# Patient Record
Sex: Female | Born: 1975 | Hispanic: No | Marital: Single | Smoking: Never smoker
Health system: Southern US, Community
[De-identification: ages and names within clinical notes are randomized; demographics above are authoritative.]

## PROBLEM LIST (undated history)

## (undated) DIAGNOSIS — R5383 Other fatigue: Secondary | ICD-10-CM

## (undated) DIAGNOSIS — R600 Localized edema: Secondary | ICD-10-CM

## (undated) DIAGNOSIS — I10 Essential (primary) hypertension: Secondary | ICD-10-CM

## (undated) DIAGNOSIS — E079 Disorder of thyroid, unspecified: Secondary | ICD-10-CM

## (undated) DIAGNOSIS — R0602 Shortness of breath: Secondary | ICD-10-CM

## (undated) DIAGNOSIS — E039 Hypothyroidism, unspecified: Secondary | ICD-10-CM

## (undated) DIAGNOSIS — R52 Pain, unspecified: Secondary | ICD-10-CM

## (undated) HISTORY — DX: Hypothyroidism, unspecified: E03.9

## (undated) HISTORY — DX: Essential (primary) hypertension: I10

## (undated) HISTORY — DX: Shortness of breath: R06.02

## (undated) HISTORY — PX: CHOLECYSTECTOMY: SHX55

## (undated) HISTORY — DX: Pain, unspecified: R52

## (undated) HISTORY — DX: Localized edema: R60.0

## (undated) HISTORY — PX: LIPOSUCTION: SHX10

## (undated) HISTORY — DX: Other fatigue: R53.83

## (undated) NOTE — Unmapped External Note (Signed)
 Formatting of this note might be different from the original. Pt present to ED with upper abdominal pain and epigastric pain X today.  Electronically signed by Lang DELENA Sane, RN at 02/19/2015  5:15 PM PDT

## (undated) NOTE — ED Notes (Signed)
 Formatting of this note might be different from the original. P;t discharged home with instructions,  Electronically signed by Tillman Basket, RN at 02/19/2015  9:04 PM PDT

## (undated) NOTE — ED Provider Notes (Signed)
 Formatting of this note is different from the original. [facility] [address] [place] [address] [phone]  History Chief Complaint  Patient presents with  ? Abdominal Pain   Patient is a 23 y.o. female presenting with abdominal pain.  History provided by:  Patient Language interpreter used: No   Abdominal Pain  Pain location:  Epigastric and RUQ Pain quality: sharp   Pain radiates to:  Does not radiate Pain severity:  Moderate Onset quality:  Gradual Duration:  8 hours Timing:  Intermittent Progression:  Worsening Chronicity:  New Context: eating and suspicious food intake   Context: not alcohol use, not diet changes and not medication withdrawal   Relieved by:  Nothing Worsened by:  Eating Ineffective treatments:  None tried Associated symptoms: no chest pain, no chills, no constipation, no cough, no diarrhea, no dysuria, no fever, no nausea, no shortness of breath and no vomiting    C/O EPIGASTRIC AND RUQ ABDOMINAL SHARP PAINS THAT ARE MOD TO SEVERE STARTING AT ABOUT 11 AM TODAY BUT BECAME WORSE WHEN SHE ATE CHILI LATER. STATES SHE TOOK 2 OF HER MOTHER'S ZANTAC WITHOUT RELIEF. WAS DIAGNOSED WITH GALLSTONES 4 MONTHS AGO History reviewed. No pertinent past medical history.  Past Surgical History  Procedure Laterality Date  ? Cesarean section     No family history on file. none pertinent  Social History  Substance Use Topics  ? Smoking status: Never Smoker  ? Smokeless tobacco: None  ? Alcohol use No   Review of Systems  Constitutional: Negative for chills and fever.  HENT: Negative.   Eyes: Negative.   Respiratory: Negative for cough, choking, chest tightness and shortness of breath.   Cardiovascular: Negative for chest pain and leg swelling.  Gastrointestinal: Positive for abdominal pain. Negative for constipation, diarrhea, nausea and vomiting.  Genitourinary: Negative for difficulty urinating, dysuria, flank pain and frequency.  All other  systems reviewed and are negative.  Physical Exam Visit Vitals  ? BP 112/41  ? Pulse 74  ? Temp 98 F (36.7 C) (Oral)  ? Resp 19  ? Ht 5' 3 (1.6 m)  ? Wt (!) 113 kg (249 lb)  ? LMP  (LMP Unknown)  ? SpO2 98%  ? BMI 44.11 kg/m2   Physical Exam  Constitutional: She is oriented to person, place, and time. She appears well-developed and well-nourished.  HENT:  Head: Normocephalic and atraumatic.  Mouth/Throat: Oropharynx is clear and moist.  Eyes: Pupils are equal, round, and reactive to light.  Neck: Normal range of motion. Neck supple.  Cardiovascular: Normal rate, regular rhythm, normal heart sounds and intact distal pulses.   Pulmonary/Chest: Effort normal and breath sounds normal.  Abdominal: Soft. Bowel sounds are normal. She exhibits no mass. There is tenderness (EPIGASTRIC AND RUQ). There is no rebound and no guarding.  Musculoskeletal: Normal range of motion. She exhibits no edema.  Neurological: She is alert and oriented to person, place, and time. She has normal reflexes.  Skin: Skin is warm and dry.  Nursing note and vitals reviewed.  Results Labs Reviewed  URINALYSIS, ROUTINE - Abnormal       Result Value   Clarity, UA Slightly-Cloudy (*)    Color, UA Yellow     Specific Gravity, UA 1.015     pH, UA 6.5     Leukocytes, UA Negative     Nitrite, UA Negative     Protein, UA Negative     Glucose, UA Negative     Urobilinogen, UA  Normal     Bilirubin, UA Negative     Blood, UA Negative     Ketones, UR Negative    COMPREHENSIVE METABOLIC PANEL - Abnormal    Albumin 3.2 (*)    Sodium 141     Potassium 3.8     Chloride 104     CO2 26.7     BUN 13.0     Creatinine 0.83     Glucose 93     Calcium 8.7     AST 21     ALT 32     Alkaline Phosphatase 81     Total Protein 7.1     Total Bilirubin 0.4     eGFR 76.5     Globulin 3.9    LIPASE, SERUM - Normal   Lipase 216    TROPONIN I - Normal   Troponin I <0.02    AMYLASE, SERUM - Normal   Amylase 109     CBC W/ AUTO DIFF   WBC 10.7     RBC 4.95     Hemoglobin 14.1     Hematocrit 42.6     MCV 86.1     MCH 28.5     MCHC 33.1     RDW 13.7     Platelets 209     MPV 11.9     Neutrophils (Relative) 59.8     Lymphocytes (Relative) 29.0     Monocytes (Relative) 9.8     Eosinophils (Relative) 1.0     Basophils (Relative) 0.4     Neutrophils (Absolute) 6.4     Lymphocytes (Absolute) 3.1     Monocytes (Absolute) 1.1     Eosinophils (Absolute) 0.1     Basophils (Absolute) 0.0    POCT PREGNANCY, URINE   Preg Test, Ur Negative     HCG INTERNAL QC Acceptable     ED Course Procedures  EKG:  Rate/Rhythm:   Normal Sinus Rhythm rate = 64 QRS, ST, T-waves:  No changes consistent w/ acute ischemia Impression:  No evidence of ischemia or arrhythmia  MDM Number of Diagnoses or Management Options Gallstones: established and improving  Amount and/or Complexity of Data Reviewed Clinical lab tests: ordered and reviewed Tests in the radiology section of CPT: ordered and reviewed  Risk of Complications, Morbidity, and/or Mortality Presenting problems: moderate Diagnostic procedures: minimal Management options: minimal  Patient Progress Patient progress: improved  ED Disposition Discharge  None Pcp, MD  Schedule an appointment as soon as possible for a visit in 1 day As needed, If symptoms worsen   Lorrayne JINNY Craven, MD 02/20/15 0002  Electronically signed by Lorrayne JINNY Craven, MD at 02/19/2015  9:02 PM PDT

---

## 2016-06-25 DIAGNOSIS — E079 Disorder of thyroid, unspecified: Secondary | ICD-10-CM | POA: Insufficient documentation

## 2018-04-29 ENCOUNTER — Encounter (HOSPITAL_BASED_OUTPATIENT_CLINIC_OR_DEPARTMENT_OTHER): Payer: Self-pay

## 2018-04-29 ENCOUNTER — Emergency Department (HOSPITAL_BASED_OUTPATIENT_CLINIC_OR_DEPARTMENT_OTHER): Payer: Self-pay

## 2018-04-29 ENCOUNTER — Emergency Department (HOSPITAL_BASED_OUTPATIENT_CLINIC_OR_DEPARTMENT_OTHER)
Admission: EM | Admit: 2018-04-29 | Discharge: 2018-04-29 | Disposition: A | Discharge status: Home / Self Care | Payer: Self-pay | Attending: Emergency Medicine | Admitting: Emergency Medicine

## 2018-04-29 ENCOUNTER — Other Ambulatory Visit: Payer: Self-pay

## 2018-04-29 DIAGNOSIS — R1032 Left lower quadrant pain: Secondary | ICD-10-CM | POA: Insufficient documentation

## 2018-04-29 DIAGNOSIS — Z9049 Acquired absence of other specified parts of digestive tract: Secondary | ICD-10-CM | POA: Insufficient documentation

## 2018-04-29 HISTORY — DX: Disorder of thyroid, unspecified: E07.9

## 2018-04-29 LAB — COMPREHENSIVE METABOLIC PANEL
ALK PHOS: 55 U/L (ref 38–126)
ALT: 16 U/L (ref 0–44)
ANION GAP: 6 (ref 5–15)
AST: 21 U/L (ref 15–41)
Albumin: 3.9 g/dL (ref 3.5–5.0)
BUN: 9 mg/dL (ref 6–20)
CO2: 26 mmol/L (ref 22–32)
Calcium: 8.9 mg/dL (ref 8.9–10.3)
Chloride: 104 mmol/L (ref 98–111)
Creatinine, Ser: 0.71 mg/dL (ref 0.44–1.00)
GFR calc non Af Amer: >60 mL/min (ref >60)
Glucose, Bld: 104 mg/dL — ABNORMAL HIGH (ref 70–99)
Potassium: 3.7 mmol/L (ref 3.5–5.1)
Sodium: 136 mmol/L (ref 135–145)
Total Bilirubin: 0.4 mg/dL (ref 0.3–1.2)
Total Protein: 7.4 g/dL (ref 6.5–8.1)

## 2018-04-29 LAB — URINALYSIS, ROUTINE W REFLEX MICROSCOPIC
Bilirubin Urine: NEGATIVE
GLUCOSE, UA: NEGATIVE mg/dL
Hgb urine dipstick: NEGATIVE
Ketones, ur: NEGATIVE mg/dL
Leukocytes, UA: NEGATIVE
Nitrite: NEGATIVE
Protein, ur: NEGATIVE mg/dL
Specific Gravity, Urine: 1.015 (ref 1.005–1.030)
pH: 7.5 (ref 5.0–8.0)

## 2018-04-29 LAB — CBC
HCT: 40.6 % (ref 36.0–46.0)
Hemoglobin: 12.9 g/dL (ref 12.0–15.0)
MCH: 30.1 pg (ref 26.0–34.0)
MCHC: 31.8 g/dL (ref 30.0–36.0)
MCV: 94.9 fL (ref 80.0–100.0)
NRBC: 0 % (ref 0.0–0.2)
Platelets: 262 10*3/uL (ref 150–400)
RBC: 4.28 MIL/uL (ref 3.87–5.11)
RDW: 12.7 % (ref 11.5–15.5)
WBC: 7.5 10*3/uL (ref 4.0–10.5)

## 2018-04-29 LAB — LIPASE, BLOOD: Lipase: 45 U/L (ref 11–51)

## 2018-04-29 LAB — PREGNANCY, URINE: Preg Test, Ur: NEGATIVE

## 2018-04-29 MED ORDER — IOPAMIDOL (ISOVUE-300) INJECTION 61%
100.0000 mL | Freq: Once | INTRAVENOUS | Status: AC | PRN
  Administered 2018-04-29: 100 mL via INTRAVENOUS

## 2018-04-29 MED ORDER — SODIUM CHLORIDE 0.9 % IV BOLUS
1000.0000 mL | Freq: Once | INTRAVENOUS | Status: AC
  Administered 2018-04-29: 1000 mL via INTRAVENOUS

## 2018-04-29 MED ORDER — ONDANSETRON HCL 4 MG PO TABS: 4.0000 mg | ORAL_TABLET | Freq: Three times a day (TID) | ORAL | 0 refills | Status: DC | PRN

## 2018-04-29 MED ORDER — OXYCODONE-ACETAMINOPHEN 5-325 MG PO TABS: 1.0000 | ORAL_TABLET | ORAL | 0 refills | Status: DC | PRN

## 2018-04-29 MED ORDER — MORPHINE SULFATE (PF) 4 MG/ML IV SOLN
4.0000 mg | Freq: Once | INTRAVENOUS | Status: AC
  Administered 2018-04-29: 4 mg via INTRAVENOUS
  Filled 2018-04-29: qty 1

## 2018-04-29 NOTE — ED Triage Notes (Signed)
C/o LLQ pain started yesterday-denies n/v/d-NAD-steady gait

## 2018-04-29 NOTE — Discharge Instructions (Signed)
Your work-up today showed no evidence of acute abnormality causing your abdominal discomfort today.  We did not see evidence of obstruction, abscess, diverticulitis, appendicitis, or kidney stones.  As we discussed, we do not have the capability for ultrasound today to definitively rule out ovarian torsion however, the CT scan did not indicate pelvic inflammation at this time.  We discussed possibility of transfer for ultrasound however given your improving symptoms, we did not feel necessary to pursue this course at this time.  Please follow-up with your OB/GYN and PCP for further management.  If you develop any new or worsened symptoms that the medication cannot help with, please return to the nearest emergency department and you will likely need a pelvic ultrasound.

## 2018-04-29 NOTE — ED Provider Notes (Signed)
[facility] EMERGENCY DEPARTMENT Provider Note   CSN: [id number] Arrival date & time: 04/29/18  1122     History   Chief Complaint Chief Complaint  Patient presents with  . Abdominal Pain    HPI Kristi Curtis is a 42 y.o. female.  The history is provided by the patient and medical records. No language interpreter was used.  Abdominal Pain   This is a new problem. The current episode started yesterday. The problem occurs constantly. The problem has not changed since onset.The pain is associated with an unknown factor. The pain is located in the LLQ. The quality of the pain is sharp. The pain is at a severity of 10/10. The pain is severe. Pertinent negatives include fever, belching, diarrhea, flatus, nausea, vomiting, constipation, dysuria, frequency and headaches. The symptoms are aggravated by palpation. Nothing relieves the symptoms.    Past Medical History:  Diagnosis Date  . Thyroid disease     There are no active problems to display for this patient.   Past Surgical History:  Procedure Laterality Date  . CHOLECYSTECTOMY       OB History   None      Home Medications    Prior to Admission medications   Not on File    Family History No family history on file.  Social History Social History   Tobacco Use  . Smoking status: Never Smoker  . Smokeless tobacco: Never Used  Substance Use Topics  . Alcohol use: Never    Frequency: Never  . Drug use: Never     Allergies   Patient has no known allergies.   Review of Systems Review of Systems  Constitutional: Negative for chills, diaphoresis, fatigue and fever.  HENT: Negative for congestion and rhinorrhea.   Respiratory: Negative for cough, chest tightness, shortness of breath and wheezing.   Cardiovascular: Negative for chest pain, palpitations and leg swelling.  Gastrointestinal: Positive for abdominal pain. Negative for abdominal distention, constipation, diarrhea, flatus, nausea and  vomiting.  Genitourinary: Negative for dysuria, flank pain and frequency.  Musculoskeletal: Negative for back pain, neck pain and neck stiffness.  Skin: Negative for rash and wound.  Neurological: Negative for light-headedness and headaches.  Psychiatric/Behavioral: Negative for agitation.  All other systems reviewed and are negative.    Physical Exam Updated Vital Signs BP (!) 155/93 (BP Location: Right Arm)   Pulse 69   Temp 98.2 F (36.8 C) (Oral)   Resp 18   Ht 5\' 3"  (1.6 m)   Wt 103.4 kg   LMP 04/18/2018   SpO2 100%   BMI 40.39 kg/m   Physical Exam  Constitutional: She appears well-developed and well-nourished. No distress.  HENT:  Head: Normocephalic and atraumatic.  Mouth/Throat: Oropharynx is clear and moist. No oropharyngeal exudate.  Eyes: Pupils are equal, round, and reactive to light. Conjunctivae are normal.  Neck: Normal range of motion. Neck supple.  Cardiovascular: Normal rate and regular rhythm.  No murmur heard. Pulmonary/Chest: Effort normal and breath sounds normal. No respiratory distress. She has no wheezes. She has no rales. She exhibits no tenderness.  Abdominal: Soft. Normal appearance. There is tenderness in the left lower quadrant. There is no rigidity, no rebound, no guarding and no CVA tenderness.    Musculoskeletal: She exhibits no edema or tenderness.  Lymphadenopathy:    She has no cervical adenopathy.  Neurological: She is alert. No sensory deficit. She exhibits normal muscle tone.  Skin: Skin is warm and dry. Capillary refill takes less than  2 seconds. No rash noted. She is not diaphoretic. No erythema.  Psychiatric: She has a normal mood and affect.  Nursing note and vitals reviewed.    ED Treatments / Results  Labs (all labs ordered are listed, but only abnormal results are displayed) Labs Reviewed  COMPREHENSIVE METABOLIC PANEL - Abnormal; Notable for the following components:      Result Value   Glucose, Bld 104 (*)    All  other components within normal limits  URINALYSIS, ROUTINE W REFLEX MICROSCOPIC - Abnormal; Notable for the following components:   APPearance CLOUDY (*)    All other components within normal limits  LIPASE, BLOOD  CBC  PREGNANCY, URINE    EKG None  Radiology Ct Abdomen Pelvis W Contrast  Result Date: 04/29/2018 CLINICAL DATA:  Abdominal pain EXAM: CT ABDOMEN AND PELVIS WITH CONTRAST TECHNIQUE: Multidetector CT imaging of the abdomen and pelvis was performed using the standard protocol following bolus administration of intravenous contrast. CONTRAST:  ISOVUE-300 IOPAMIDOL (ISOVUE-300) INJECTION 61% COMPARISON:  None. FINDINGS: Lower chest: Lung bases demonstrate no acute consolidation or effusion. The heart size is normal Hepatobiliary: No focal liver abnormality is seen. Status post cholecystectomy. No biliary dilatation. Pancreas: Unremarkable. No pancreatic ductal dilatation or surrounding inflammatory changes. Spleen: Normal in size without focal abnormality. Adrenals/Urinary Tract: Adrenal glands are unremarkable. Kidneys are normal, without renal calculi, focal lesion, or hydronephrosis. Bladder is unremarkable. Stomach/Bowel: Stomach is within normal limits. Appendix appears normal. No evidence of bowel wall thickening, distention, or inflammatory changes. Few sigmoid colon diverticula without acute inflammatory process. Vascular/Lymphatic: Nonaneurysmal aorta. No significantly enlarged lymph nodes. Reproductive: Uterus is unremarkable.  2.3 cm left adnexal cyst. Other: Negative for free air or free fluid. Scarring within the low anterior abdominal wall. Musculoskeletal: No acute or suspicious abnormality. IMPRESSION: 1. No CT evidence for acute intra-abdominal or pelvic abnormality. 2. Few sigmoid colon diverticula without acute inflammatory process. Electronically Signed   By: Jasmine Pang M.D.   On: 04/29/2018 18:23    Procedures Procedures (including critical care  time)  Medications Ordered in ED Medications  morphine 4 MG/ML injection 4 mg (4 mg Intravenous Given 04/29/18 1602)  sodium chloride 0.9 % bolus 1,000 mL ( Intravenous Stopped 04/29/18 1704)  iopamidol (ISOVUE-300) 61 % injection 100 mL (100 mLs Intravenous Contrast Given 04/29/18 1737)     Initial Impression / Assessment and Plan / ED Course  I have reviewed the triage vital signs and the nursing notes.  Pertinent labs & imaging results that were available during my care of the patient were reviewed by me and considered in my medical decision making (see chart for details).     Analayah Brooke is a 42 y.o. female with a past medical history significant for prior cholecystectomy, 2 tummy tucks and 2 C-sections who presents with left lower abdominal pain.  She denies any history of kidney stones, ovarian torsion, or diverticulitis but reports yesterday afternoon she had sudden onset of severe sharp left lower quadrant abdominal pain.  She reports he said no nausea, vomiting, and had a normal bowel movement this morning with no bleeding constipation or diarrhea.  She reports the pain is up to 10 out of 10 and is sharp.  It comes and goes.  She reports having a very brief episode several months ago that lasted several hours but went away.  This is been persistent.  She denies fevers, chills, congestion, cough, shortness of breath, chest pain, palpitations.  She denies any trauma.  She  denies any new medications.  She has not taken any medicine to help her symptoms.  She denies any vaginal discharge, vaginal bleeding, or pelvic pain.  On exam, patient has severe left lower quadrant tenderness.  No CVA tenderness or flank tenderness.  Abdomen otherwise nontender.  Normal bowel sounds.  Lungs clear chest nontender.  Legs are nontender or edematous.  Clinically I am concerned patient may have either diverticulitis, idiopathic abdominal pain, or ovarian torsion.  Due to facility restraints, we will order  pelvic ultrasound initially as the ultrasound tech will leave in the next 10 minutes.  Typically we would do a pelvic exam initially however shared decision in conversation held to obtain ultrasound first.  If ultrasound is negative, will likely proceed with CT scan to rule out diverticulitis as cause of the 10 out of 10 severe abdominal pain.  Patient was given pain medicine and fluids initially.  Anticipate reassessment after work-up.  4:40 PM Was informed several minutes ago that the ultrasound is not can be available at this facility due to ultrasound tech leaving.  Thus, we will have CT scan instead of pelvic ultrasound initially.  If there is any concern for pelvic abnormality on CT scan, patient will likely need transfer for pelvic ultrasound.  Patient CT scan was reassuring.  Patient has diverticulosis but not diverticulitis.  Patient was feeling better after medications.  Patient is not interested in transfer for ultrasound at this time.  Patient will give prescription for pain medicine and nausea medicine and will be discharged home.  She will follow-up with her PCP and understood strict return precautions.  Patient had no other questions or concerns and was discharged in good condition.   Final Clinical Impressions(s) / ED Diagnoses   Final diagnoses:  LLQ abdominal pain    ED Discharge Orders         Ordered    oxyCODONE-acetaminophen (PERCOCET/ROXICET) 5-325 MG tablet  Every 4 hours PRN     04/29/18 1931    ondansetron (ZOFRAN) 4 MG tablet  Every 8 hours PRN     04/29/18 1931         Clinical Impression: 1. LLQ abdominal pain     Disposition: Discharge  Condition: Good  I have discussed the results, Dx and Tx plan with the pt(& family if present). He/she/they expressed understanding and agree(s) with the plan. Discharge instructions discussed at great length. Strict return precautions discussed and pt &/or family have verbalized understanding of the instructions. No  further questions at time of discharge.    Discharge Medication List as of 04/29/2018  7:32 PM    START taking these medications   Details  ondansetron (ZOFRAN) 4 MG tablet Take 1 tablet (4 mg total) by mouth every 8 (eight) hours as needed for nausea or vomiting., Starting Tue 04/29/2018, Print    oxyCODONE-acetaminophen (PERCOCET/ROXICET) 5-325 MG tablet Take 1 tablet by mouth every 4 (four) hours as needed., Starting Tue 04/29/2018, Print        Follow Up: [facility] [address] [place] [address] [phone] Schedule an appointment as soon as possible for a visit    [facility] [address] [id number] [place] [address] [phone]       , Canary Brim, MD 04/30/18 [phone]

## 2018-04-29 NOTE — ED Notes (Signed)
LLQ Flank pain for 2 days. Denies vaginal discharge. Vomiting or diarrhea. Pt resting on stretcher comfortably.

## 2018-04-29 NOTE — ED Notes (Signed)
PO fluids given

## 2018-09-08 DIAGNOSIS — R109 Unspecified abdominal pain: Secondary | ICD-10-CM | POA: Diagnosis not present

## 2018-09-08 DIAGNOSIS — R198 Other specified symptoms and signs involving the digestive system and abdomen: Secondary | ICD-10-CM | POA: Diagnosis not present

## 2019-06-05 DIAGNOSIS — Z20828 Contact with and (suspected) exposure to other viral communicable diseases: Secondary | ICD-10-CM | POA: Diagnosis not present

## 2019-07-15 DIAGNOSIS — U071 COVID-19: Secondary | ICD-10-CM | POA: Diagnosis not present

## 2019-07-15 DIAGNOSIS — R002 Palpitations: Secondary | ICD-10-CM | POA: Diagnosis not present

## 2019-07-15 DIAGNOSIS — R0602 Shortness of breath: Secondary | ICD-10-CM | POA: Diagnosis not present

## 2019-07-15 DIAGNOSIS — R55 Syncope and collapse: Secondary | ICD-10-CM | POA: Diagnosis not present

## 2019-07-17 DIAGNOSIS — I340 Nonrheumatic mitral (valve) insufficiency: Secondary | ICD-10-CM

## 2019-07-17 DIAGNOSIS — I361 Nonrheumatic tricuspid (valve) insufficiency: Secondary | ICD-10-CM

## 2019-07-17 DIAGNOSIS — J40 Bronchitis, not specified as acute or chronic: Secondary | ICD-10-CM | POA: Diagnosis not present

## 2019-07-17 DIAGNOSIS — R0602 Shortness of breath: Secondary | ICD-10-CM | POA: Diagnosis not present

## 2019-07-17 DIAGNOSIS — R002 Palpitations: Secondary | ICD-10-CM | POA: Diagnosis not present

## 2019-07-22 DIAGNOSIS — E039 Hypothyroidism, unspecified: Secondary | ICD-10-CM | POA: Diagnosis not present

## 2019-07-22 DIAGNOSIS — J209 Acute bronchitis, unspecified: Secondary | ICD-10-CM | POA: Diagnosis not present

## 2019-09-01 DIAGNOSIS — E039 Hypothyroidism, unspecified: Secondary | ICD-10-CM | POA: Diagnosis not present

## 2019-09-01 DIAGNOSIS — R748 Abnormal levels of other serum enzymes: Secondary | ICD-10-CM | POA: Diagnosis not present

## 2019-09-02 DIAGNOSIS — Z6841 Body Mass Index (BMI) 40.0 and over, adult: Secondary | ICD-10-CM | POA: Diagnosis not present

## 2019-09-02 DIAGNOSIS — R609 Edema, unspecified: Secondary | ICD-10-CM | POA: Diagnosis not present

## 2019-09-02 DIAGNOSIS — E039 Hypothyroidism, unspecified: Secondary | ICD-10-CM | POA: Diagnosis not present

## 2019-10-01 DIAGNOSIS — M25562 Pain in left knee: Secondary | ICD-10-CM | POA: Diagnosis not present

## 2019-10-02 DIAGNOSIS — M25562 Pain in left knee: Secondary | ICD-10-CM | POA: Diagnosis not present

## 2019-10-02 DIAGNOSIS — S8992XA Unspecified injury of left lower leg, initial encounter: Secondary | ICD-10-CM | POA: Diagnosis not present

## 2019-10-13 DIAGNOSIS — E039 Hypothyroidism, unspecified: Secondary | ICD-10-CM | POA: Diagnosis not present

## 2020-05-24 ENCOUNTER — Other Ambulatory Visit: Payer: Self-pay

## 2020-05-24 ENCOUNTER — Encounter (INDEPENDENT_AMBULATORY_CARE_PROVIDER_SITE_OTHER): Payer: Self-pay | Admitting: Family Medicine

## 2020-05-24 ENCOUNTER — Ambulatory Visit (INDEPENDENT_AMBULATORY_CARE_PROVIDER_SITE_OTHER): Payer: BC Managed Care – PPO | Admitting: Family Medicine

## 2020-05-24 VITALS — BP 148/87 | HR 56 | Temp 97.6°F | Ht 64.0 in | Wt 287.0 lb

## 2020-05-24 DIAGNOSIS — Z0289 Encounter for other administrative examinations: Secondary | ICD-10-CM

## 2020-05-24 DIAGNOSIS — U099 Post covid-19 condition, unspecified: Secondary | ICD-10-CM

## 2020-05-24 DIAGNOSIS — R0683 Snoring: Secondary | ICD-10-CM

## 2020-05-24 DIAGNOSIS — R600 Localized edema: Secondary | ICD-10-CM

## 2020-05-24 DIAGNOSIS — R0602 Shortness of breath: Secondary | ICD-10-CM | POA: Diagnosis not present

## 2020-05-24 DIAGNOSIS — Z9189 Other specified personal risk factors, not elsewhere classified: Secondary | ICD-10-CM | POA: Diagnosis not present

## 2020-05-24 DIAGNOSIS — R5383 Other fatigue: Secondary | ICD-10-CM | POA: Diagnosis not present

## 2020-05-24 DIAGNOSIS — E039 Hypothyroidism, unspecified: Secondary | ICD-10-CM | POA: Diagnosis not present

## 2020-05-24 DIAGNOSIS — I10 Essential (primary) hypertension: Secondary | ICD-10-CM

## 2020-05-24 DIAGNOSIS — E038 Other specified hypothyroidism: Secondary | ICD-10-CM | POA: Diagnosis not present

## 2020-05-24 DIAGNOSIS — F3289 Other specified depressive episodes: Secondary | ICD-10-CM

## 2020-05-24 DIAGNOSIS — R7303 Prediabetes: Secondary | ICD-10-CM | POA: Diagnosis not present

## 2020-05-24 DIAGNOSIS — Z6841 Body Mass Index (BMI) 40.0 and over, adult: Secondary | ICD-10-CM

## 2020-05-24 DIAGNOSIS — E559 Vitamin D deficiency, unspecified: Secondary | ICD-10-CM | POA: Diagnosis not present

## 2020-05-24 NOTE — Progress Notes (Signed)
Chief Complaint:   OBESITY Kristi Curtis (MR# [id number]) is a 45 y.o. female who presents for evaluation and treatment of obesity and related comorbidities. Current BMI is Body mass index is 49.26 kg/m. Kristi Curtis has been struggling with her weight for many years and has been unsuccessful in either losing weight, maintaining weight loss, or reaching her healthy weight goal.  Kristi Curtis is currently in the action stage of change and ready to dedicate time achieving and maintaining a healthier weight. Kristi Curtis is interested in becoming our patient and working on intensive lifestyle modifications including (but not limited to) diet and exercise for weight loss.  Kristi Curtis works at [employer] for 40+ hours per week.  She lives with her boyfriend and 2 sons (67 and 7).  She wants to be vegan.  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  She had a tummy tuck 2 years ago.  Kristi Curtis's habits were reviewed today and are as follows: Her family eats meals together, she thinks her family will eat healthier with her, her desired weight loss is 110 pounds, she started gaining weight after age 56, her heaviest weight ever was 275 pounds, she craves bread and sweets, she is trying to follow a vegan diet, she is frequently drinking liquids with calories, she frequently makes poor food choices and she struggles with emotional eating.  Depression Screen Kristi Curtis's Food and Mood (modified PHQ-9) score was 12.  Depression screen PHQ 2/9 05/24/2020  Decreased Interest 2  Down, Depressed, Hopeless 2  PHQ - 2 Score 4  Altered sleeping 3  Tired, decreased energy 3  Change in appetite 2  Feeling bad or failure about yourself  0  Trouble concentrating 0  Moving slowly or fidgety/restless 0  Suicidal thoughts 0  PHQ-9 Score 12  Difficult doing work/chores Not difficult at all   Assessment/Plan:   1. Other fatigue Kristi Curtis admits to daytime somnolence and reports waking up still tired. Patent has a history of symptoms of daytime  fatigue, morning fatigue and snoring. Kristi Curtis generally gets 5 hours of sleep per night. Snoring is present. Apneic episodes are present. Epworth Sleepiness Score is 5.  [name] does feel that her weight is causing her energy to be lower than it should be. Fatigue may be related to obesity, depression or many other causes. Labs will be ordered, and in the meanwhile, Kristi Curtis will focus on self care including making healthy food choices, increasing physical activity and focusing on stress reduction.  - EKG 12-Lead - Anemia panel - CBC with Differential/Platelet - Comprehensive metabolic panel - Hemoglobin A1c - Insulin, random - Lipid panel - VITAMIN D 25 Hydroxy (Vit-D Deficiency, Fractures) - TSH - T4, free  2. SOB (shortness of breath) on exertion Kristi Curtis notes increasing shortness of breath with exercising and seems to be worsening over time with weight gain. She notes getting out of breath sooner with activity than she used to. This has gotten worse recently. Kristi Curtis denies shortness of breath at rest or orthopnea.  Kristi Curtis does feel that she gets out of breath more easily that she used to when she exercises. Kristi Curtis's shortness of breath appears to be obesity related and exercise induced. She has agreed to work on weight loss and gradually increase exercise to treat her exercise induced shortness of breath. Will continue to monitor closely.  - EKG 12-Lead - Anemia panel - CBC with Differential/Platelet - Comprehensive metabolic panel - Hemoglobin A1c - Insulin, random - Lipid panel - VITAMIN  D 25 Hydroxy (Vit-D Deficiency, Fractures) - TSH - T4, free  3. Other specified hypothyroidism Medication: levothyroxine 175 mcg daily.   Plan: Patient was instructed not to take MVM or iron within 4 hours of taking thyroid medications. We will continue to monitor symptoms as they relate to her weight loss journey.  4. Essential hypertension Elevated today. Medications: None.   Plan: Avoid buying foods that are:  processed, frozen, or prepackaged to avoid excess salt. We will continue to monitor symptoms as they relate to her weight loss journey.  BP Readings from Last 3 Encounters:  05/24/20 (!) 148/87  04/29/18 (!) 148/78   Lab Results  Component Value Date   CREATININE 0.71 04/29/2018   5. Snores Situation Chance of Dozing or Sleeping  Sitting and reading 1 = slight chance of dozing or sleeping  Watching television 1 = slight chance of dozing or sleeping  Sitting as a passenger in a car for an hour 1 = slight chance of dozing or sleeping  Sitting inactive in a public place (theater or meeting) 0 = would never doze or sleep  Lying down in the afternoon when circumstances permit 1 = slight chance of dozing or sleeping  Sitting and talking to someone 0 = would never doze or sleep  Sitting quietly after lunch without alcohol 1 = slight chance of dozing or sleeping  In a car, while stopped for a few minutes in traffic 0 = would never doze or sleep  TOTAL 5   6. Lower extremity edema Amyria has 1-2+ lower extremity edema.  Plan:  Will consider diuretic at next visit.  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  8. Other depression, with emotional eating Behavior modification techniques were discussed today to help Kristi Curtis deal with her emotional/non-hunger eating behaviors.    9. At risk for heart disease Kristi Curtis was given approximately 10 minutes of coronary artery disease prevention counseling today. She is 45 y.o. female and has risk factors for heart disease including obesity and hypertension. We discussed intensive lifestyle modifications today with an emphasis on specific weight loss instructions and strategies.   10. Class 3 severe obesity with serious comorbidity and body mass index (BMI) of 45.0 to 49.9 in adult, unspecified obesity type (HCC)  Kristi Curtis is currently in the action stage of change and her goal is to continue with weight loss efforts. I recommend Sona begin the structured  treatment plan as follows:  She has agreed to the Category 2 Plan (vegan with chicken).  Exercise goals: No exercise has been prescribed at this time.   Behavioral modification strategies: increasing lean protein intake, decreasing simple carbohydrates, increasing vegetables, increasing water intake, decreasing liquid calories, decreasing alcohol intake and decreasing sodium intake.  She was informed of the importance of frequent follow-up visits to maximize her success with intensive lifestyle modifications for her multiple health conditions. She was informed we would discuss her lab results at her next visit unless there is a critical issue that needs to be addressed sooner. Candis agreed to keep her next visit at the agreed upon time to discuss these results.  Objective:   Blood pressure (!) 148/87, pulse (!) 56, temperature 97.6 F (36.4 C), temperature source Oral, height 5\' 4"  (1.626 m), weight 287 lb (130.2 kg), SpO2 96 %. Body mass index is 49.26 kg/m.  EKG: Normal sinus rhythm, rate 69 bpm.  Indirect Calorimeter completed today shows a VO2 of 224 and a REE of 1556.  Her calculated basal  metabolic rate is 6269 thus her basal metabolic rate is worse than expected.  General: Cooperative, alert, well developed, in no acute distress. HEENT: Conjunctivae and lids unremarkable. Cardiovascular: Regular rhythm.  Lungs: Normal work of breathing. Neurologic: No focal deficits.   Lab Results  Component Value Date   CREATININE 0.71 04/29/2018   BUN 9 04/29/2018   Kristi 136 04/29/2018   K 3.7 04/29/2018   CL 104 04/29/2018   CO2 26 04/29/2018   Lab Results  Component Value Date   ALT 16 04/29/2018   AST 21 04/29/2018   ALKPHOS 55 04/29/2018   BILITOT 0.4 04/29/2018   Lab Results  Component Value Date   WBC 7.5 04/29/2018   HGB 12.9 04/29/2018   HCT 40.6 04/29/2018   MCV 94.9 04/29/2018   PLT 262 04/29/2018   Attestation Statements:   Reviewed by clinician on day of visit:  allergies, medications, problem list, medical history, surgical history, family history, social history, and previous encounter notes.  This is the patient's first visit at [facility]. The patient's NEW PATIENT PACKET was reviewed at length. Included in the packet: current and past health history, medications, allergies, ROS, gynecologic history (women only), surgical history, family history, social history, weight history, weight loss surgery history (for those that have had weight loss surgery), nutritional evaluation, mood and food questionnaire, PHQ9, Epworth questionnaire, sleep habits questionnaire, patient life and health improvement goals questionnaire. These will all be scanned into the patient's chart under media.   During the visit, I independently reviewed the patient's EKG, bioimpedance scale results, and indirect calorimeter results. I used this information to tailor a meal plan for the patient that will help her to lose weight and will improve her obesity-related conditions going forward. I performed a medically necessary appropriate examination and/or evaluation. I discussed the assessment and treatment plan with the patient. The patient was provided an opportunity to ask questions and all were answered. The patient agreed with the plan and demonstrated an understanding of the instructions. Labs were ordered at this visit and will be reviewed at the next visit unless more critical results need to be addressed immediately. Clinical information was updated and documented in the EMR.   I, Insurance claims handler, CMA, am acting as transcriptionist for Helane Rima, DO  I have reviewed the above documentation for accuracy and completeness, and I agree with the above. Helane Rima, DO

## 2020-05-25 LAB — CBC WITH DIFFERENTIAL/PLATELET
Basophils Absolute: 0.1 10*3/uL (ref 0.0–0.2)
Basos: 1 %
EOS (ABSOLUTE): 0.1 10*3/uL (ref 0.0–0.4)
Eos: 2 %
Hemoglobin: 15.1 g/dL (ref 11.1–15.9)
Immature Grans (Abs): 0 10*3/uL (ref 0.0–0.1)
Immature Granulocytes: 0 %
Lymphocytes Absolute: 2 10*3/uL (ref 0.7–3.1)
Lymphs: 28 %
MCH: 28.9 pg (ref 26.6–33.0)
MCHC: 32.1 g/dL (ref 31.5–35.7)
MCV: 90 fL (ref 79–97)
Monocytes Absolute: 0.4 10*3/uL (ref 0.1–0.9)
Monocytes: 5 %
Neutrophils Absolute: 4.7 10*3/uL (ref 1.4–7.0)
Neutrophils: 64 %
Platelets: 264 10*3/uL (ref 150–450)
RBC: 5.23 x10E6/uL (ref 3.77–5.28)
RDW: 14.9 % (ref 11.7–15.4)
WBC: 7.3 10*3/uL (ref 3.4–10.8)

## 2020-05-25 LAB — COMPREHENSIVE METABOLIC PANEL
ALT: 67 IU/L — ABNORMAL HIGH (ref 0–32)
AST: 66 IU/L — ABNORMAL HIGH (ref 0–40)
Albumin/Globulin Ratio: 1.5 (ref 1.2–2.2)
Albumin: 4.3 g/dL (ref 3.8–4.8)
Alkaline Phosphatase: 79 IU/L (ref 44–121)
BUN/Creatinine Ratio: 6 — ABNORMAL LOW (ref 9–23)
BUN: 6 mg/dL (ref 6–24)
Bilirubin Total: 0.6 mg/dL (ref 0.0–1.2)
CO2: 26 mmol/L (ref 20–29)
Calcium: 9.4 mg/dL (ref 8.7–10.2)
Chloride: 100 mmol/L (ref 96–106)
Creatinine, Ser: 0.93 mg/dL (ref 0.57–1.00)
GFR calc Af Amer: 86 mL/min/{1.73_m2} (ref >59)
GFR calc non Af Amer: 75 mL/min/{1.73_m2} (ref >59)
Globulin, Total: 2.9 g/dL (ref 1.5–4.5)
Glucose: 81 mg/dL (ref 65–99)
Potassium: 4.1 mmol/L (ref 3.5–5.2)
Sodium: 139 mmol/L (ref 134–144)
Total Protein: 7.2 g/dL (ref 6.0–8.5)

## 2020-05-25 LAB — ANEMIA PANEL
Ferritin: 69 ng/mL (ref 15–150)
Folate, Hemolysate: 417 ng/mL
Folate, RBC: 887 ng/mL (ref >498)
Hematocrit: 47 % — ABNORMAL HIGH (ref 34.0–46.6)
Iron Saturation: 23 % (ref 15–55)
Iron: 95 ug/dL (ref 27–159)
Retic Ct Pct: 1.6 % (ref 0.6–2.6)
Total Iron Binding Capacity: 405 ug/dL (ref 250–450)
UIBC: 310 ug/dL (ref 131–425)
Vitamin B-12: 1156 pg/mL (ref 232–1245)

## 2020-05-25 LAB — LIPID PANEL
Chol/HDL Ratio: 3.5 ratio (ref 0.0–4.4)
Cholesterol, Total: 222 mg/dL — ABNORMAL HIGH (ref 100–199)
HDL: 63 mg/dL (ref >39)
LDL Chol Calc (NIH): 137 mg/dL — ABNORMAL HIGH (ref 0–99)
Triglycerides: 126 mg/dL (ref 0–149)
VLDL Cholesterol Cal: 22 mg/dL (ref 5–40)

## 2020-05-25 LAB — TSH: TSH: 226 u[IU]/mL — ABNORMAL HIGH (ref 0.450–4.500)

## 2020-05-25 LAB — HEMOGLOBIN A1C
Est. average glucose Bld gHb Est-mCnc: 137 mg/dL
Hgb A1c MFr Bld: 6.4 % — ABNORMAL HIGH (ref 4.8–5.6)

## 2020-05-25 LAB — VITAMIN D 25 HYDROXY (VIT D DEFICIENCY, FRACTURES): Vit D, 25-Hydroxy: 8.6 ng/mL — ABNORMAL LOW (ref 30.0–100.0)

## 2020-05-25 LAB — T4, FREE: Free T4: <0.1 ng/dL — ABNORMAL LOW (ref 0.82–1.77)

## 2020-05-25 LAB — INSULIN, RANDOM: INSULIN: 18 u[IU]/mL (ref 2.6–24.9)

## 2020-06-01 ENCOUNTER — Telehealth (INDEPENDENT_AMBULATORY_CARE_PROVIDER_SITE_OTHER): Payer: Self-pay

## 2020-06-01 NOTE — Telephone Encounter (Signed)
Spoke with pt about results

## 2020-06-01 NOTE — Telephone Encounter (Signed)
This patient called returning your call from yesterday.  Phone # [phone]

## 2020-06-02 ENCOUNTER — Other Ambulatory Visit (INDEPENDENT_AMBULATORY_CARE_PROVIDER_SITE_OTHER): Payer: Self-pay

## 2020-06-02 DIAGNOSIS — R7989 Other specified abnormal findings of blood chemistry: Secondary | ICD-10-CM

## 2020-06-02 DIAGNOSIS — E038 Other specified hypothyroidism: Secondary | ICD-10-CM

## 2020-06-06 DIAGNOSIS — E039 Hypothyroidism, unspecified: Secondary | ICD-10-CM | POA: Insufficient documentation

## 2020-06-06 DIAGNOSIS — U099 Post covid-19 condition, unspecified: Secondary | ICD-10-CM | POA: Insufficient documentation

## 2020-06-06 DIAGNOSIS — F32A Depression, unspecified: Secondary | ICD-10-CM | POA: Insufficient documentation

## 2020-06-06 DIAGNOSIS — I10 Essential (primary) hypertension: Secondary | ICD-10-CM | POA: Insufficient documentation

## 2020-06-06 NOTE — Progress Notes (Signed)
TeleHealth Visit:  Due to the COVID-19 pandemic, this visit was completed with telemedicine (audio/video) technology to reduce patient and provider exposure as well as to preserve personal protective equipment.   Kristi Curtis has verbally consented to this TeleHealth visit. The patient is located at home, the provider is located at the [facility] office. The participants in this visit include the listed provider and patient and. The visit was conducted today via MyChart.  OBESITY Kristi Curtis is here to discuss her progress with her obesity treatment plan along with follow-up of her obesity related diagnoses.   Today's visit was #: 2 Starting weight: 287 lbs Starting date: 05/24/2020 Today's date: 06/07/2020  Interim History: Kristi Curtis is scheduled for an appointment with Endocrinology on 07/11/2020. Nutrition Plan: the Category 2 Plan for 90% of the time.  Activity: None at this time.  Assessment/Plan:   1. Essential hypertension Elevated today.  Medications: None.   Plan: Avoid buying foods that are: processed, frozen, or prepackaged to avoid excess salt. We will continue to monitor symptoms as they relate to her weight loss journey.  BP Readings from Last 3 Encounters:  05/24/20 (!) 148/87  04/29/18 (!) 148/78   Lab Results  Component Value Date   CREATININE 0.93 05/24/2020   2. COVID-19 long hauler Stable. We will continue to monitor symptoms as they relate to her weight loss journey. She will continue to focus on protein-rich, low simple carbohydrate foods. We reviewed the importance of hydration, regular exercise for stress reduction, and restorative sleep.   3. Acquired hypothyroidism Medication: levothyroxine 175 mcg daily.   Lab Results  Component Value Date   TSH 226.000 (H) 05/24/2020   Plan: Patient was instructed not to take MVM or iron within 4 hours of taking thyroid medications. We will continue to monitor symptoms as they relate to her weight loss journey.  4.  Elevated liver function tests, AST/ALT, mild Treatment includes weight loss, elimination of sweet drinks, including juice, avoidance of high fructose corn syrup, and exercise. As always, avoiding alcohol consumption is important. We will continue to monitor labs.  Lab Results  Component Value Date   ALT 67 (H) 05/24/2020   AST 66 (H) 05/24/2020   ALKPHOS 79 05/24/2020   BILITOT 0.6 05/24/2020   5. Prediabetes Not at goal. Goal is HgbA1c < 5.7.  Medication: None.  She will continue to focus on protein-rich, low simple carbohydrate foods. We reviewed the importance of hydration, regular exercise for stress reduction, and restorative sleep.   Lab Results  Component Value Date   HGBA1C 6.4 (H) 05/24/2020   Lab Results  Component Value Date   INSULIN 18.0 05/24/2020   6. Pure hypercholesterolemia Lipid-lowering medications: None.   Plan: Dietary changes: Increase soluble fiber. Decrease simple carbohydrates. Exercise changes: An average 40 minutes of moderate to vigorous-intensity aerobic activity 3 or 4 times per week.   Lab Results  Component Value Date   CHOL 222 (H) 05/24/2020   HDL 63 05/24/2020   LDLCALC 137 (H) 05/24/2020   TRIG 126 05/24/2020   CHOLHDL 3.5 05/24/2020   Lab Results  Component Value Date   ALT 67 (H) 05/24/2020   AST 66 (H) 05/24/2020   ALKPHOS 79 05/24/2020   BILITOT 0.6 05/24/2020   The 10-year ASCVD risk score Kristi George DC Jr., et al., 2013) is: 1%   Values used to calculate the score:     Age: 6 years     Sex: Female     Is  Non-Hispanic African American: No     Diabetic: No     Tobacco smoker: No     Systolic Blood Pressure: 148 mmHg     Is BP treated: No     HDL Cholesterol: 63 mg/dL     Total Cholesterol: 222 mg/dL  7. Vitamin D deficiency Not at goal. Current vitamin D is 8.6, tested on 05/24/2020. Optimal goal > 50 ng/dL.   Plan:  []   Continue Vitamin D @50 ,000 IU every week. [x]   Continue home supplement daily. [x]   Follow-up for  routine testing of Vitamin D at least 2-3 times per year to avoid over-replacement.  8. Other depression, with emotional eating Discussed cues and consequences, how thoughts affect eating, model of thoughts, feelings, and behaviors, and strategies for change by focusing on the cue. Discussed cognitive distortions, coping thoughts, and how to change your thoughts.  9. Class 3 severe obesity with serious comorbidity and body mass index (BMI) of 45.0 to 49.9 in adult, unspecified obesity type (HCC)  Kristi Curtis is currently in the action stage of change. As such, her goal is to continue with weight loss efforts. She has agreed to the Category 2 Plan.   Exercise goals: All adults should avoid inactivity. Some physical activity is better than none, and adults who participate in any amount of physical activity gain some health benefits.  Behavioral modification strategies: increasing lean protein intake, decreasing simple carbohydrates, increasing vegetables and increasing water intake.  Daune has agreed to follow-up with our clinic in 2 weeks. She was informed of the importance of frequent follow-up visits to maximize her success with intensive lifestyle modifications for her multiple health conditions.  Objective:   VITALS: Per patient if applicable, see vitals. GENERAL: Alert and in no acute distress. CARDIOPULMONARY: No increased WOB. Speaking in clear sentences.  PSYCH: Pleasant and cooperative. Speech normal rate and rhythm. Affect is appropriate. Insight and judgement are appropriate. Attention is focused, linear, and appropriate.  NEURO: Oriented as arrived to appointment on time with no prompting.   Lab Results  Component Value Date   CREATININE 0.93 05/24/2020   BUN 6 05/24/2020   NA 139 05/24/2020   K 4.1 05/24/2020   CL 100 05/24/2020   CO2 26 05/24/2020   Lab Results  Component Value Date   ALT 67 (H) 05/24/2020   AST 66 (H) 05/24/2020   ALKPHOS 79 05/24/2020   BILITOT 0.6  05/24/2020   Lab Results  Component Value Date   HGBA1C 6.4 (H) 05/24/2020   Lab Results  Component Value Date   INSULIN 18.0 05/24/2020   Lab Results  Component Value Date   TSH 226.000 (H) 05/24/2020   Lab Results  Component Value Date   CHOL 222 (H) 05/24/2020   HDL 63 05/24/2020   LDLCALC 137 (H) 05/24/2020   TRIG 126 05/24/2020   CHOLHDL 3.5 05/24/2020   Lab Results  Component Value Date   WBC 7.3 05/24/2020   HGB 15.1 05/24/2020   HCT 47.0 (H) 05/24/2020   MCV 90 05/24/2020   PLT 264 05/24/2020   Lab Results  Component Value Date   IRON 95 05/24/2020   TIBC 405 05/24/2020   FERRITIN 69 05/24/2020   Attestation Statements:   Reviewed by clinician on day of visit: allergies, medications, problem list, medical history, surgical history, family history, social history, and previous encounter notes. Time: 30 minutes.  I, 07/22/2020, CMA, am acting as transcriptionist for 07/22/2020, DO  I have reviewed the above  documentation for accuracy and completeness, and I agree with the above. Helane Rima, DO

## 2020-06-07 ENCOUNTER — Telehealth (INDEPENDENT_AMBULATORY_CARE_PROVIDER_SITE_OTHER): Payer: BC Managed Care – PPO | Admitting: Family Medicine

## 2020-06-07 ENCOUNTER — Other Ambulatory Visit: Payer: Self-pay

## 2020-06-07 ENCOUNTER — Encounter (INDEPENDENT_AMBULATORY_CARE_PROVIDER_SITE_OTHER): Payer: Self-pay | Admitting: Family Medicine

## 2020-06-07 DIAGNOSIS — R7303 Prediabetes: Secondary | ICD-10-CM

## 2020-06-07 DIAGNOSIS — I10 Essential (primary) hypertension: Secondary | ICD-10-CM

## 2020-06-07 DIAGNOSIS — R7989 Other specified abnormal findings of blood chemistry: Secondary | ICD-10-CM | POA: Diagnosis not present

## 2020-06-07 DIAGNOSIS — E7800 Pure hypercholesterolemia, unspecified: Secondary | ICD-10-CM

## 2020-06-07 DIAGNOSIS — E559 Vitamin D deficiency, unspecified: Secondary | ICD-10-CM

## 2020-06-07 DIAGNOSIS — U099 Post covid-19 condition, unspecified: Secondary | ICD-10-CM | POA: Diagnosis not present

## 2020-06-07 DIAGNOSIS — E039 Hypothyroidism, unspecified: Secondary | ICD-10-CM

## 2020-06-07 DIAGNOSIS — Z6841 Body Mass Index (BMI) 40.0 and over, adult: Secondary | ICD-10-CM

## 2020-06-07 DIAGNOSIS — F3289 Other specified depressive episodes: Secondary | ICD-10-CM

## 2020-06-22 ENCOUNTER — Other Ambulatory Visit: Payer: Self-pay

## 2020-06-22 ENCOUNTER — Encounter (INDEPENDENT_AMBULATORY_CARE_PROVIDER_SITE_OTHER): Payer: Self-pay | Admitting: Family Medicine

## 2020-06-22 ENCOUNTER — Ambulatory Visit (INDEPENDENT_AMBULATORY_CARE_PROVIDER_SITE_OTHER): Payer: BC Managed Care – PPO | Admitting: Family Medicine

## 2020-06-22 VITALS — BP 144/89 | HR 77 | Temp 98.1°F | Ht 64.0 in | Wt 282.0 lb

## 2020-06-22 DIAGNOSIS — K5909 Other constipation: Secondary | ICD-10-CM

## 2020-06-22 DIAGNOSIS — M2550 Pain in unspecified joint: Secondary | ICD-10-CM | POA: Diagnosis not present

## 2020-06-22 DIAGNOSIS — M25562 Pain in left knee: Secondary | ICD-10-CM | POA: Diagnosis not present

## 2020-06-22 DIAGNOSIS — Z9189 Other specified personal risk factors, not elsewhere classified: Secondary | ICD-10-CM | POA: Diagnosis not present

## 2020-06-22 DIAGNOSIS — R632 Polyphagia: Secondary | ICD-10-CM

## 2020-06-22 DIAGNOSIS — E039 Hypothyroidism, unspecified: Secondary | ICD-10-CM

## 2020-06-22 DIAGNOSIS — Z6841 Body Mass Index (BMI) 40.0 and over, adult: Secondary | ICD-10-CM

## 2020-06-22 MED ORDER — MELOXICAM 15 MG PO TABS: 15.0000 mg | ORAL_TABLET | Freq: Every day | ORAL | 0 refills | Status: DC

## 2020-06-23 ENCOUNTER — Telehealth (INDEPENDENT_AMBULATORY_CARE_PROVIDER_SITE_OTHER): Payer: Self-pay

## 2020-06-23 ENCOUNTER — Encounter (INDEPENDENT_AMBULATORY_CARE_PROVIDER_SITE_OTHER): Payer: Self-pay | Admitting: Family Medicine

## 2020-06-23 LAB — CBC WITH DIFFERENTIAL/PLATELET
Basophils Absolute: 0.1 10*3/uL (ref 0.0–0.2)
Basos: 1 %
EOS (ABSOLUTE): 0.1 10*3/uL (ref 0.0–0.4)
Eos: 2 %
Hematocrit: 42.2 % (ref 34.0–46.6)
Hemoglobin: 14.1 g/dL (ref 11.1–15.9)
Immature Grans (Abs): 0 10*3/uL (ref 0.0–0.1)
Immature Granulocytes: 0 %
Lymphocytes Absolute: 2.2 10*3/uL (ref 0.7–3.1)
Lymphs: 30 %
MCH: 30.6 pg (ref 26.6–33.0)
MCHC: 33.4 g/dL (ref 31.5–35.7)
MCV: 92 fL (ref 79–97)
Monocytes Absolute: 0.6 10*3/uL (ref 0.1–0.9)
Monocytes: 8 %
Neutrophils Absolute: 4.5 10*3/uL (ref 1.4–7.0)
Neutrophils: 59 %
Platelets: 263 10*3/uL (ref 150–450)
RBC: 4.61 x10E6/uL (ref 3.77–5.28)
RDW: 14.5 % (ref 11.7–15.4)
WBC: 7.6 10*3/uL (ref 3.4–10.8)

## 2020-06-23 LAB — TSH: TSH: 13.9 u[IU]/mL — ABNORMAL HIGH (ref 0.450–4.500)

## 2020-06-23 LAB — T4, FREE: Free T4: 1.63 ng/dL (ref 0.82–1.77)

## 2020-06-23 LAB — SEDIMENTATION RATE: Sed Rate: 42 mm/hr — ABNORMAL HIGH (ref 0–32)

## 2020-06-23 MED ORDER — SAXENDA 18 MG/3ML ~~LOC~~ SOPN: 3.0000 mg | PEN_INJECTOR | Freq: Every day | SUBCUTANEOUS | 0 refills | Status: DC

## 2020-06-23 MED ORDER — INSULIN PEN NEEDLE 32G X 4 MM MISC: 0 refills | Status: DC

## 2020-06-23 NOTE — Telephone Encounter (Signed)
Error

## 2020-06-27 NOTE — Progress Notes (Signed)
Chief Complaint:   OBESITY Kristi Curtis is here to discuss her progress with her obesity treatment plan along with follow-up of her obesity related diagnoses.   Today's visit was #: 3 Starting weight: 287 lbs Starting date: 05/24/2020 Today's weight: 282 lbs Today's date: 06/22/2020 Total lbs lost to date: 5 lbs Body mass index is 48.41 kg/m.  Total weight loss percentage to date: -1.74%  Interim History: Kristi Curtis says she is sitting all day for work, so is moving less and getting stiff.  We reviewed desk stretches and exercise. Nutrition Plan: Category 2 Plan for 85% of the time.. Activity: None at this time.  Assessment/Plan:   1. Polyphagia Uncontrolled.  Current treatment: None. Polyphagia refers to excessive feelings of hunger. She will continue to focus on protein-rich, low simple carbohydrate foods. We reviewed the importance of hydration, regular exercise for stress reduction, and restorative sleep.  - Start Liraglutide -Weight Management (SAXENDA) 18 MG/3ML SOPN; Inject 3 mg into the skin daily.  Dispense: 15 mL; Refill: 0 - Start Insulin Pen Needle 32G X 4 MM MISC; Use daily with saxenda  Dispense: 100 each; Refill: 0  2. Acquired hypothyroidism Medication: levothyroxine 175 mcg dialy. Upcoming appointment with Endocrinology to evaluate inconsistent TSH results.   Plan: Patient was instructed not to take MVM or iron within 4 hours of taking thyroid medications. We will continue to monitor alongside Endocrinology/PCP as it relates to her weight loss journey.  Will recheck TSH and free T4 today.  Lab Results  Component Value Date   TSH 13.900 (H) 06/22/2020   TSH 226.000 (H) 05/24/2020   FREET4 1.63 06/22/2020   FREET4 <0.10 (L) 05/24/2020    3. Arthralgia, unspecified joint Will check sed rate and CBC today. Start meloxicam, as per below.  - CBC with Differential/Platelet - Start meloxicam (MOBIC) 15 MG tablet; Take 1 tablet (15 mg total) by mouth daily.  Dispense: 30  tablet; Refill: 0 - Sedimentation rate  4. Other constipation Kristi Curtis was informed that a decrease in bowel movement frequency is normal while losing weight, but stools should not be hard or painful.    Counseling: Getting to Good Bowel Health: Your goal is to have one soft bowel movement each day. Drink at least 8 glasses of water each day. Eat plenty of fiber (goal is over 30 grams each day). It is best to get most of your fiber from dietary sources which includes leafy green vegetables, fresh fruit, and whole grains. You may need to add fiber with the help of OTC fiber supplements. These include Metamucil, Citrucel, and Benefiber. If you are still having trouble, try adding an osmotic laxative such as Miralax. If all of these changes do not work, Dietitian.   5. At risk for heart disease Due to Kristi Curtis's current state of health and medical condition(s), she is at a higher risk for heart disease.  This puts the patient at much greater risk to subsequently develop cardiopulmonary conditions that can significantly affect patient's quality of life in a negative manner.    At least 10 minutes were spent on counseling Kristi Curtis about these concerns today. Counseling:  Intensive lifestyle modifications were discussed with Kristi Curtis as the most appropriate first line of treatment.  she will continue to work on diet, exercise, and weight loss efforts.  We will continue to reassess these conditions on a fairly regular basis in an attempt to decrease the patient's overall morbidity and mortality.  Evidence-based interventions for health behavior change  were utilized today including the discussion of self monitoring techniques, problem-solving barriers, and SMART goal setting techniques.  Specifically, regarding patient's less desirable eating habits and patterns, we employed the technique of small changes when Kristi Curtis has not been able to fully commit to her prudent nutritional plan.  6. Class 3 severe obesity with  serious comorbidity and body mass index (BMI) of 45.0 to 49.9 in adult, unspecified obesity type (HCC)  Course: Kristi Curtis is currently in the action stage of change. As such, her goal is to continue with weight loss efforts.   Nutrition goals: She has agreed to the Category 2 Plan.   Exercise goals: For substantial health benefits, adults should do at least 150 minutes (2 hours and 30 minutes) a week of moderate-intensity, or 75 minutes (1 hour and 15 minutes) a week of vigorous-intensity aerobic physical activity, or an equivalent combination of moderate- and vigorous-intensity aerobic activity. Aerobic activity should be performed in episodes of at least 10 minutes, and preferably, it should be spread throughout the week.  Behavioral modification strategies: increasing lean protein intake, decreasing simple carbohydrates, increasing vegetables, increasing water intake and decreasing liquid calories.  Kristi Curtis has agreed to follow-up with our clinic in 2 weeks. She was informed of the importance of frequent follow-up visits to maximize her success with intensive lifestyle modifications for her multiple health conditions.   Kristi Curtis was informed we would discuss her lab results at her next visit unless there is a critical issue that needs to be addressed sooner. Kristi Curtis agreed to keep her next visit at the agreed upon time to discuss these results.  Objective:   Blood pressure (!) 144/89, pulse 77, temperature 98.1 F (36.7 C), temperature source Oral, height 5\' 4"  (1.626 m), weight 282 lb (127.9 kg), SpO2 96 %. Body mass index is 48.41 kg/m.  General: Cooperative, alert, well developed, in no acute distress. HEENT: Conjunctivae and lids unremarkable. Cardiovascular: Regular rhythm.  Lungs: Normal work of breathing. Neurologic: No focal deficits.   Lab Results  Component Value Date   CREATININE 0.93 05/24/2020   BUN 6 05/24/2020   NA 139 05/24/2020   K 4.1 05/24/2020   CL 100 05/24/2020   CO2 26  05/24/2020   Lab Results  Component Value Date   ALT 67 (H) 05/24/2020   AST 66 (H) 05/24/2020   ALKPHOS 79 05/24/2020   BILITOT 0.6 05/24/2020   Lab Results  Component Value Date   HGBA1C 6.4 (H) 05/24/2020   Lab Results  Component Value Date   INSULIN 18.0 05/24/2020   Lab Results  Component Value Date   TSH 13.900 (H) 06/22/2020   Lab Results  Component Value Date   CHOL 222 (H) 05/24/2020   HDL 63 05/24/2020   LDLCALC 137 (H) 05/24/2020   TRIG 126 05/24/2020   CHOLHDL 3.5 05/24/2020   Lab Results  Component Value Date   WBC 7.6 06/22/2020   HGB 14.1 06/22/2020   HCT 42.2 06/22/2020   MCV 92 06/22/2020   PLT 263 06/22/2020   Lab Results  Component Value Date   IRON 95 05/24/2020   TIBC 405 05/24/2020   FERRITIN 69 05/24/2020   Attestation Statements:   Reviewed by clinician on day of visit: allergies, medications, problem list, medical history, surgical history, family history, social history, and previous encounter notes.  I, 07/22/2020, CMA, am acting as transcriptionist for Insurance claims handler, DO  I have reviewed the above documentation for accuracy and completeness, and I agree with the above. -  Briscoe Deutscher, DO

## 2020-07-07 ENCOUNTER — Encounter (INDEPENDENT_AMBULATORY_CARE_PROVIDER_SITE_OTHER): Payer: Self-pay | Admitting: Physician Assistant

## 2020-07-07 ENCOUNTER — Other Ambulatory Visit: Payer: Self-pay

## 2020-07-07 ENCOUNTER — Ambulatory Visit (INDEPENDENT_AMBULATORY_CARE_PROVIDER_SITE_OTHER): Payer: BC Managed Care – PPO | Admitting: Physician Assistant

## 2020-07-07 VITALS — BP 144/86 | HR 84 | Temp 98.6°F | Ht 64.0 in | Wt 287.0 lb

## 2020-07-07 DIAGNOSIS — R7303 Prediabetes: Secondary | ICD-10-CM | POA: Diagnosis not present

## 2020-07-07 DIAGNOSIS — E65 Localized adiposity: Secondary | ICD-10-CM

## 2020-07-07 DIAGNOSIS — Z9189 Other specified personal risk factors, not elsewhere classified: Secondary | ICD-10-CM

## 2020-07-07 DIAGNOSIS — Z6841 Body Mass Index (BMI) 40.0 and over, adult: Secondary | ICD-10-CM | POA: Diagnosis not present

## 2020-07-07 MED ORDER — OZEMPIC (0.25 OR 0.5 MG/DOSE) 2 MG/1.5ML ~~LOC~~ SOPN: 0.2500 mg | PEN_INJECTOR | SUBCUTANEOUS | 0 refills | Status: DC

## 2020-07-07 NOTE — Telephone Encounter (Signed)
Hey I coded it as Pre-Dm and visceral obesity and hopefully can get it approved. Fingers crossed.

## 2020-07-07 NOTE — Telephone Encounter (Signed)
She has an appointment with you today. Insurance doesn't cover AOM, A1c 6.4. She has an appointment with Endo soon to look at her wonky thyroid numbers. I can't remember if she has visceral obesity or metabolic syndrome - could try ozempic or victoza with those dx. Thanks for seeing her!!

## 2020-07-11 ENCOUNTER — Encounter: Payer: Self-pay | Admitting: Internal Medicine

## 2020-07-11 ENCOUNTER — Ambulatory Visit: Payer: BC Managed Care – PPO | Admitting: Internal Medicine

## 2020-07-11 ENCOUNTER — Other Ambulatory Visit: Payer: Self-pay

## 2020-07-11 VITALS — BP 140/82 | HR 80 | Ht 64.0 in | Wt 285.5 lb

## 2020-07-11 DIAGNOSIS — E039 Hypothyroidism, unspecified: Secondary | ICD-10-CM

## 2020-07-11 MED ORDER — SYNTHROID 200 MCG PO TABS: 200.0000 ug | ORAL_TABLET | Freq: Every day | ORAL | 4 refills | Status: DC

## 2020-07-11 NOTE — Patient Instructions (Signed)

## 2020-07-11 NOTE — Progress Notes (Addendum)
Chief Complaint:   OBESITY Kristi Curtis is here to discuss her progress with her obesity treatment plan along with follow-up of her obesity related diagnoses. Kristi Curtis is on the Category 2 Plan and states she is following her eating plan approximately 90% of the time. Kristi Curtis states she is doing 0 minutes 0 times per week.  Today's visit was #: 4 Starting weight: 287 lbs Starting date: 05/24/2020 Today's weight: 287 lbs Today's date: 07/07/2020 Total lbs lost to date: 0 Total lbs lost since last in-office visit: 0  Interim History: Kristi Curtis states that she is eating eggs cooked with vegan butter, and she is putting butter on her toast with breakfast and eating salads at lunch with oil and vinegar. She is not eating dinner most nights due to lack of hunger.  Subjective:   1. Pre-diabetes Kristi Curtis is not on medications currently, and she denies polyphagia, but she endorses sweet cravings.  2. Visceral obesity Kristi Curtis's last visceral fat rating was 19. Her goal is <10.  3. At risk for diabetes mellitus Kristi Curtis is at higher than average risk for developing diabetes due to obesity.   Assessment/Plan:   1. Pre-diabetes Kristi Curtis will continue to work on weight loss, exercise, and decreasing simple carbohydrates to help decrease the risk of diabetes. Kristi Curtis agreed to start Ozempic 0.25 mg weekly #4 with no refills.  2. Visceral obesity Kristi Curtis will start Ozempic to help with weight loss, and she will continue with her meal plan.  3. At risk for diabetes mellitus Kristi Curtis was given approximately 15 minutes of diabetes education and counseling today. We discussed intensive lifestyle modifications today with an emphasis on weight loss as well as increasing exercise and decreasing simple carbohydrates in her diet. We also reviewed medication options with an emphasis on risk versus benefit of those discussed.   Repetitive spaced learning was employed today to elicit superior memory formation and behavioral change.  4. 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 As such, her goal is to continue with weight loss efforts. She has agreed to the Category 2 Plan.   Kristi Curtis was advised not to use oil or butter. She may use skinny girl salad dressing. She must eat dinner and break up portions as needed.  Exercise goals: No exercise has been prescribed at this time.  Behavioral modification strategies: increasing lean protein intake and no skipping meals.  Kristi Curtis has agreed to follow-up with our clinic in 2 weeks. She was informed of the importance of frequent follow-up visits to maximize her success with intensive lifestyle modifications for her multiple health conditions.   Objective:   Blood pressure (!) 144/86, pulse 84, temperature 98.6 F (37 C), height 5\' 4"  (1.626 m), weight 287 lb (130.2 kg), last menstrual period 06/16/2020, SpO2 95 %. Body mass index is 49.26 kg/m.  General: Cooperative, alert, well developed, in no acute distress. HEENT: Conjunctivae and lids unremarkable. Cardiovascular: Regular rhythm.  Lungs: Normal work of breathing. Neurologic: No focal deficits.   Lab Results  Component Value Date   CREATININE 0.93 05/24/2020   BUN 6 05/24/2020   NA 139 05/24/2020   K 4.1 05/24/2020   CL 100 05/24/2020   CO2 26 05/24/2020   Lab Results  Component Value Date   ALT 67 (H) 05/24/2020   AST 66 (H) 05/24/2020   ALKPHOS 79 05/24/2020   BILITOT  0.6 05/24/2020   Lab Results  Component Value Date   HGBA1C 6.4 (H) 05/24/2020   Lab Results  Component Value Date   INSULIN 18.0 05/24/2020   Lab Results  Component Value Date   TSH 13.900 (H) 06/22/2020   Lab Results  Component Value Date   CHOL 222 (H) 05/24/2020   HDL 63 05/24/2020   LDLCALC 137 (H) 05/24/2020   TRIG 126 05/24/2020   CHOLHDL 3.5 05/24/2020   Lab Results  Component Value Date   WBC 7.6 06/22/2020    HGB 14.1 06/22/2020   HCT 42.2 06/22/2020   MCV 92 06/22/2020   PLT 263 06/22/2020   Lab Results  Component Value Date   IRON 95 05/24/2020   TIBC 405 05/24/2020   FERRITIN 69 05/24/2020   Attestation Statements:   Reviewed by clinician on day of visit: allergies, medications, problem list, medical history, surgical history, family history, social history, and previous encounter notes.   Trude Mcburney, am acting as transcriptionist for [employer], PA-C.  I have reviewed the above documentation for accuracy and completeness, and I agree with the above. Alois Cliche, PA-C

## 2020-07-11 NOTE — Progress Notes (Signed)
Name: Kristi Curtis  MRN/ DOB: [id number], [birth year]    Age/ Sex: 45 y.o., female    PCP: Dema Severin, NP   Reason for Endocrinology Evaluation: Hypothyroidism     Date of Initial Endocrinology Evaluation: 07/11/2020     HPI: [name] is a 45 y.o. female with a past medical history of Hypothyroidism. The patient presented for initial endocrinology clinic visit on 07/11/2020 for consultative assistance with her Hypothyroidism.     During evaluation for obesity through the [facility] healthy weight and wellness she was noted with a TSH of 226 uIU/mL    She was diagnosed with hypothyroidism since the age 49. Has been on LT-4 replacement   She was having SOB as well.  Denies local neck symptoms  Has occasional constipation     She is on A1 + Zinc  No biotin     Levothyroxine 175 mcg daily  Has 2 kids 12 and 7    Mother with hx of thyroid disease   HISTORY:  Past Medical History:  Past Medical History:  Diagnosis Date  . Body aches   . Fatigue   . Hypertension   . Hypothyroidism   . Lower extremity edema   . SOB (shortness of breath)   . Thyroid disease    Past Surgical History:  Past Surgical History:  Procedure Laterality Date  . CESAREAN SECTION    . CHOLECYSTECTOMY    . LIPOSUCTION    . LIPOSUCTION        Social History:  reports that she has never smoked. She has never used smokeless tobacco. She reports that she does not drink alcohol and does not use drugs.  Family History: family history includes Hypertension in her father and mother.   HOME MEDICATIONS: Allergies as of 07/11/2020   No Known Allergies     Medication List       Accurate as of July 11, 2020  3:03 PM. If you have any questions, ask your nurse or doctor.        STOP taking these medications   levothyroxine 175 MCG tablet Commonly known as: SYNTHROID Replaced by: Synthroid 200 MCG tablet Stopped by: Scarlette Shorts, MD     TAKE these medications   Insulin Pen  Needle 32G X 4 MM Misc Use daily with saxenda   meloxicam 15 MG tablet Commonly known as: MOBIC Take 1 tablet (15 mg total) by mouth daily.   Ozempic (0.25 or 0.5 MG/DOSE) 2 MG/1.5ML Sopn Generic drug: Semaglutide(0.25 or 0.5MG /DOS) Inject 0.25 mg into the skin once a week.   Synthroid 200 MCG tablet Generic drug: levothyroxine Take 1 tablet (200 mcg total) by mouth daily before breakfast. Replaces: levothyroxine 175 MCG tablet Started by: Scarlette Shorts, MD         REVIEW OF SYSTEMS: A comprehensive ROS was conducted with the patient and is negative except as per HPI and below:  ROS     OBJECTIVE:  VS: BP 140/82   Pulse 80   Ht 5\' 4"  (1.626 m)   Wt 285 lb 8 oz (129.5 kg)   LMP 06/16/2020   SpO2 98%   BMI 49.01 kg/m    Wt Readings from Last 3 Encounters:  07/11/20 285 lb 8 oz (129.5 kg)  07/07/20 287 lb (130.2 kg)  06/22/20 282 lb (127.9 kg)     EXAM: General: Pt appears well and is in NAD  Eyes: External eye exam normal without stare, lid lag or  exophthalmos.  EOM intact.  PERRL.  Neck: General: Supple without adenopathy. Thyroid: Thyroid size normal.  No goiter or nodules appreciated. No thyroid bruit.  Lungs: Clear with good BS bilat with no rales, rhonchi, or wheezes  Heart: Auscultation: RRR.  Abdomen: Normoactive bowel sounds, soft, nontender, without masses or organomegaly palpable  Extremities:  BL LE: No pretibial edema normal ROM and strength.  Skin: Hair: Texture and amount normal with gender appropriate distribution Skin Inspection: No rashes, skin tags around the neck  Skin Palpation: Skin temperature, texture, and thickness normal to palpation  Neuro: Cranial nerves: II - XII grossly intact  Motor: Normal strength throughout DTRs: 2+ and symmetric in UE without delay in relaxation phase  Mental Status: Judgment, insight: Intact Orientation: Oriented to time, place, and person Mood and affect: No depression, anxiety, or agitation      DATA REVIEWED:   Results for WHITTNEY, STEENSON (MRN [id number]) as of 07/11/2020 14:49  Ref. Range 05/24/2020 11:15 06/22/2020 15:40  TSH Latest Ref Range: 0.450 - 4.500 uIU/mL 226.000 (H) 13.900 (H)  T4,Free(Direct) Latest Ref Range: 0.82 - 1.77 ng/dL <3.82 (L) 5.05    ASSESSMENT/PLAN/RECOMMENDATIONS:   1. Hypothyroidism :  - Pt educated extensively on the correct way to take levothyroxine (first thing in the morning with water, 30 minutes before eating or taking other medications). - Pt encouraged to double dose the following day if she were to miss a dose given long half-life of levothyroxine. - TSh continues to be above goal, will increase levothyroxine as below   Medications : Stop levothyroxine 175 mcg daily  Start Levothyroxine 200 mcg daily    Labs in 6 weeks F/U in 3 months     Signed electronically by: Lyndle Herrlich, MD  [facility]  [facility] [address] [place] [place], [place] [address] Phone: [phone] FAX: [phone]   CC: Dema Severin, NP [place] [address] [facility] [place] [address] Phone: [phone] Fax: [phone]   Return to Endocrinology clinic as below: Future Appointments  Date Time Provider Department Center  07/19/2020  2:40 PM Helane Rima, DO [facility] None  08/16/2020  2:00 PM Alois Cliche, PA-C [facility] None

## 2020-07-12 ENCOUNTER — Encounter (INDEPENDENT_AMBULATORY_CARE_PROVIDER_SITE_OTHER): Payer: Self-pay | Admitting: Physician Assistant

## 2020-07-12 NOTE — Telephone Encounter (Signed)
Last seen Tracey 

## 2020-07-13 NOTE — Telephone Encounter (Signed)
[facility],   Ozempic was prescribed on last visit. Please advise.  Blondie Riggsbee, CMA

## 2020-07-14 NOTE — Telephone Encounter (Signed)
Can you find out if they covered Ozempic and if she picked it up please?

## 2020-07-19 ENCOUNTER — Ambulatory Visit (INDEPENDENT_AMBULATORY_CARE_PROVIDER_SITE_OTHER): Payer: BC Managed Care – PPO | Admitting: Family Medicine

## 2020-07-25 ENCOUNTER — Other Ambulatory Visit (INDEPENDENT_AMBULATORY_CARE_PROVIDER_SITE_OTHER): Payer: Self-pay | Admitting: Family Medicine

## 2020-07-25 DIAGNOSIS — M2550 Pain in unspecified joint: Secondary | ICD-10-CM

## 2020-07-25 NOTE — Telephone Encounter (Signed)
Do we know if the ozempic is covered?

## 2020-07-25 NOTE — Telephone Encounter (Signed)
She has not started the Ozempic.

## 2020-07-25 NOTE — Telephone Encounter (Signed)
Pt seen Kennith Center last

## 2020-07-27 ENCOUNTER — Encounter (INDEPENDENT_AMBULATORY_CARE_PROVIDER_SITE_OTHER): Payer: Self-pay | Admitting: Physician Assistant

## 2020-07-27 ENCOUNTER — Ambulatory Visit (INDEPENDENT_AMBULATORY_CARE_PROVIDER_SITE_OTHER): Payer: BC Managed Care – PPO | Admitting: Physician Assistant

## 2020-07-27 ENCOUNTER — Other Ambulatory Visit: Payer: Self-pay

## 2020-07-27 VITALS — BP 143/95 | HR 76 | Temp 98.3°F | Ht 64.0 in | Wt 279.0 lb

## 2020-07-27 DIAGNOSIS — Z6841 Body Mass Index (BMI) 40.0 and over, adult: Secondary | ICD-10-CM | POA: Diagnosis not present

## 2020-07-27 DIAGNOSIS — I10 Essential (primary) hypertension: Secondary | ICD-10-CM

## 2020-08-02 NOTE — Progress Notes (Signed)
Chief Complaint:   OBESITY Kristi Curtis is here to discuss her progress with her obesity treatment plan along with follow-up of her obesity related diagnoses. Kristi Curtis is on the Category 2 Plan and states she is following her eating plan approximately 75% of the time. Kristi Curtis states she is doing 0 minutes 0 times per week.  Today's visit was #: 5 Starting weight: 287 lbs Starting date: 05/24/2020 Today's weight: 279 lbs Today's date: 07/27/2020 Total lbs lost to date: 8 Total lbs lost since last in-office visit: 8  Interim History: Kristi Curtis's insurance finally approved Saxenda, but she hasn't started it yet. She did very well with weight loss despite stressors with her family.  Subjective:   1. Essential hypertension [name]'s blood pressure is slightly elevated today. She is not on medications, and she denies headache or chest pain.  Assessment/Plan:   1. Essential hypertension [name] will continue with healthy weight loss and exercise to improve blood pressure control. We will watch for signs of hypotension as she continues her lifestyle modifications.  2. Class 3 severe obesity with serious comorbidity and body mass index (BMI) of 45.0 to 49.9 in adult, unspecified obesity type (HCC) Kristi Curtis is currently in the action stage of change. As such, her goal is to continue with weight loss efforts. She has agreed to the Category 2 Plan.   We discussed various medication options to help Kristi Curtis with her weight loss efforts and we both agreed to start Saxenda 0.6 mg daily.  Exercise goals: No exercise has been prescribed at this time.  Behavioral modification strategies: meal planning and cooking strategies and keeping healthy foods in the home.  Kristi Curtis has agreed to follow-up with our clinic in 2 weeks. She was informed of the importance of frequent follow-up visits to maximize her success with intensive lifestyle modifications for her multiple health conditions.   Objective:   Blood pressure (!) 143/95, pulse 76,  temperature 98.3 F (36.8 C), height 5\' 4"  (1.626 m), weight 279 lb (126.6 kg), SpO2 96 %. Body mass index is 47.89 kg/m.  General: Cooperative, alert, well developed, in no acute distress. HEENT: Conjunctivae and lids unremarkable. Cardiovascular: Regular rhythm.  Lungs: Normal work of breathing. Neurologic: No focal deficits.   Lab Results  Component Value Date   CREATININE 0.93 05/24/2020   BUN 6 05/24/2020   NA 139 05/24/2020   K 4.1 05/24/2020   CL 100 05/24/2020   CO2 26 05/24/2020   Lab Results  Component Value Date   ALT 67 (H) 05/24/2020   AST 66 (H) 05/24/2020   ALKPHOS 79 05/24/2020   BILITOT 0.6 05/24/2020   Lab Results  Component Value Date   HGBA1C 6.4 (H) 05/24/2020   Lab Results  Component Value Date   INSULIN 18.0 05/24/2020   Lab Results  Component Value Date   TSH 13.900 (H) 06/22/2020   Lab Results  Component Value Date   CHOL 222 (H) 05/24/2020   HDL 63 05/24/2020   LDLCALC 137 (H) 05/24/2020   TRIG 126 05/24/2020   CHOLHDL 3.5 05/24/2020   Lab Results  Component Value Date   WBC 7.6 06/22/2020   HGB 14.1 06/22/2020   HCT 42.2 06/22/2020   MCV 92 06/22/2020   PLT 263 06/22/2020   Lab Results  Component Value Date   IRON 95 05/24/2020   TIBC 405 05/24/2020   FERRITIN 69 05/24/2020   Attestation Statements:   Reviewed by clinician on day of visit: allergies, medications, problem list, medical  history, surgical history, family history, social history, and previous encounter notes.  Time spent on visit including pre-visit chart review and post-visit care and charting was 32 minutes.    Trude Mcburney, am acting as transcriptionist for [employer], PA-C.  I have reviewed the above documentation for accuracy and completeness, and I agree with the above. Alois Cliche, PA-C

## 2020-08-16 ENCOUNTER — Encounter (INDEPENDENT_AMBULATORY_CARE_PROVIDER_SITE_OTHER): Payer: Self-pay | Admitting: Physician Assistant

## 2020-08-16 ENCOUNTER — Ambulatory Visit (INDEPENDENT_AMBULATORY_CARE_PROVIDER_SITE_OTHER): Payer: BC Managed Care – PPO | Admitting: Physician Assistant

## 2020-08-16 ENCOUNTER — Other Ambulatory Visit: Payer: Self-pay

## 2020-08-16 VITALS — BP 138/87 | HR 71 | Temp 98.5°F | Ht 64.0 in | Wt 274.0 lb

## 2020-08-16 DIAGNOSIS — E039 Hypothyroidism, unspecified: Secondary | ICD-10-CM

## 2020-08-16 DIAGNOSIS — Z6841 Body Mass Index (BMI) 40.0 and over, adult: Secondary | ICD-10-CM | POA: Diagnosis not present

## 2020-08-16 DIAGNOSIS — E66813 Obesity, class 3: Secondary | ICD-10-CM

## 2020-08-22 NOTE — Progress Notes (Signed)
Chief Complaint:   OBESITY Kristi Curtis is here to discuss her progress with her obesity treatment plan along with follow-up of her obesity related diagnoses. Kristi Curtis is on the Category 2 Plan and states she is following her eating plan approximately 90% of the time. Kristi Curtis states she is doing 0 minutes 0 times per week.  Today's visit was #: 6 Starting weight: 287 lbs Starting date: 05/24/2020 Today's weight: 274 lbs Today's date: 08/16/2020 Total lbs lost to date: 13 Total lbs lost since last in-office visit: 5  Interim History: Kristi Curtis did very well with weight loss. She is on Saxenda 0.6 mg which is helping with portion control and decreasing cravings. She is able to eat all of her food. She wants to start exercising.  Subjective:   1. Acquired hypothyroidism Kristi Curtis sees her Endocrinologist on April 6th. She is taking 200 mg of Synthroid daily.  Assessment/Plan:   1. Acquired hypothyroidism Kristi Curtis will follow up with her Endocrinologist, and continue her medications. Orders and follow up as documented in patient record.  Counseling . Good thyroid control is important for overall health. Supratherapeutic thyroid levels are dangerous and will not improve weight loss results. . Counseling: The correct way to take levothyroxine is fasting, with water, separated by at least 30 minutes from breakfast, and separated by more than 4 hours from calcium, iron, multivitamins, acid reflux medications (PPIs).   2. Class 3 severe obesity with serious comorbidity and body mass index (BMI) of 45.0 to 49.9 in adult, unspecified obesity type (HCC) Kristi Curtis is currently in the action stage of change. As such, her goal is to continue with weight loss efforts. She has agreed to the Category 2 Plan.   Exercise goals: No exercise has been prescribed at this time.  Behavioral modification strategies: meal planning and cooking strategies and keeping healthy foods in the home.  Kristi Curtis has agreed to follow-up with our clinic in 2  weeks. She was informed of the importance of frequent follow-up visits to maximize her success with intensive lifestyle modifications for her multiple health conditions.   Objective:   Blood pressure 138/87, pulse 71, temperature 98.5 F (36.9 C), height 5\' 4"  (1.626 m), weight 274 lb (124.3 kg), SpO2 98 %. Body mass index is 47.03 kg/m.  General: Cooperative, alert, well developed, in no acute distress. HEENT: Conjunctivae and lids unremarkable. Cardiovascular: Regular rhythm.  Lungs: Normal work of breathing. Neurologic: No focal deficits.   Lab Results  Component Value Date   CREATININE 0.93 05/24/2020   BUN 6 05/24/2020   NA 139 05/24/2020   K 4.1 05/24/2020   CL 100 05/24/2020   CO2 26 05/24/2020   Lab Results  Component Value Date   ALT 67 (H) 05/24/2020   AST 66 (H) 05/24/2020   ALKPHOS 79 05/24/2020   BILITOT 0.6 05/24/2020   Lab Results  Component Value Date   HGBA1C 6.4 (H) 05/24/2020   Lab Results  Component Value Date   INSULIN 18.0 05/24/2020   Lab Results  Component Value Date   TSH 13.900 (H) 06/22/2020   Lab Results  Component Value Date   CHOL 222 (H) 05/24/2020   HDL 63 05/24/2020   LDLCALC 137 (H) 05/24/2020   TRIG 126 05/24/2020   CHOLHDL 3.5 05/24/2020   Lab Results  Component Value Date   WBC 7.6 06/22/2020   HGB 14.1 06/22/2020   HCT 42.2 06/22/2020   MCV 92 06/22/2020   PLT 263 06/22/2020   Lab Results  Component Value Date   IRON 95 05/24/2020   TIBC 405 05/24/2020   FERRITIN 69 05/24/2020   Attestation Statements:   Reviewed by clinician on day of visit: allergies, medications, problem list, medical history, surgical history, family history, social history, and previous encounter notes.  Time spent on visit including pre-visit chart review and post-visit care and charting was 30 minutes.    Trude Mcburney, am acting as transcriptionist for [employer], PA-C.  I have reviewed the above documentation for accuracy  and completeness, and I agree with the above. Alois Cliche, PA-C

## 2020-08-24 ENCOUNTER — Other Ambulatory Visit: Payer: Self-pay

## 2020-08-24 ENCOUNTER — Other Ambulatory Visit (INDEPENDENT_AMBULATORY_CARE_PROVIDER_SITE_OTHER): Payer: BC Managed Care – PPO

## 2020-08-24 ENCOUNTER — Encounter: Payer: Self-pay | Admitting: Internal Medicine

## 2020-08-24 DIAGNOSIS — E039 Hypothyroidism, unspecified: Secondary | ICD-10-CM

## 2020-08-24 LAB — TSH: TSH: 0.7 u[IU]/mL (ref 0.35–4.50)

## 2020-08-31 ENCOUNTER — Ambulatory Visit (INDEPENDENT_AMBULATORY_CARE_PROVIDER_SITE_OTHER): Payer: BC Managed Care – PPO | Admitting: Physician Assistant

## 2020-09-21 ENCOUNTER — Ambulatory Visit (INDEPENDENT_AMBULATORY_CARE_PROVIDER_SITE_OTHER): Payer: BC Managed Care – PPO | Admitting: Physician Assistant

## 2020-09-21 ENCOUNTER — Encounter (INDEPENDENT_AMBULATORY_CARE_PROVIDER_SITE_OTHER): Payer: Self-pay | Admitting: Physician Assistant

## 2020-09-21 ENCOUNTER — Ambulatory Visit (INDEPENDENT_AMBULATORY_CARE_PROVIDER_SITE_OTHER): Payer: BC Managed Care – PPO | Admitting: Family Medicine

## 2020-09-21 ENCOUNTER — Other Ambulatory Visit: Payer: Self-pay

## 2020-09-21 VITALS — BP 138/82 | HR 69 | Temp 98.1°F | Ht 64.0 in | Wt 269.0 lb

## 2020-09-21 DIAGNOSIS — Z6841 Body Mass Index (BMI) 40.0 and over, adult: Secondary | ICD-10-CM | POA: Diagnosis not present

## 2020-09-21 DIAGNOSIS — R7303 Prediabetes: Secondary | ICD-10-CM | POA: Diagnosis not present

## 2020-09-22 NOTE — Progress Notes (Signed)
Chief Complaint:   OBESITY Kristi Curtis is here to discuss her progress with her obesity treatment plan along with follow-up of her obesity related diagnoses. Kristi Curtis is on the Category 2 Plan and states she is following her eating plan approximately 85% of the time. Kristi Curtis states she is walking for 10-15 minutes 3 times per week.  Today's visit was #: 7 Starting weight: 287 lbs Starting date: 05/24/2020 Today's weight: 269 lbs Today's date: 09/21/2020 Total lbs lost to date: 18 Total lbs lost since last in-office visit: 5  Interim History: Kristi Curtis returned from vacation 1 week ago. She is on 0.6 mg of Saxenda and her appetite is controlled. She continues to enjoy the Category 2 plan.  Subjective:   1. Pre-diabetes Kristi Curtis denies polyphagia. Last A1c was 6.4, and she is exercising regularly.  Assessment/Plan:   1. Pre-diabetes Kristi Curtis will continue her meal plan, and will continue to work on weight loss, increasing exercise, and decreasing simple carbohydrates to help decrease the risk of diabetes. We will recheck labs at her next visit.  2. Class 3 severe obesity with serious comorbidity and body mass index (BMI) of 45.0 to 49.9 in adult, unspecified obesity type (HCC) Kristi Curtis is currently in the action stage of change. As such, her goal is to continue with weight loss efforts. She has agreed to the Category 2 Plan.   We will recheck fasting labs at her next visit.  Exercise goals: As is.  Behavioral modification strategies: meal planning and cooking strategies and keeping healthy foods in the home.  Kristi Curtis has agreed to follow-up with our clinic in 3 weeks. She was informed of the importance of frequent follow-up visits to maximize her success with intensive lifestyle modifications for her multiple health conditions.   Objective:   Blood pressure 138/82, pulse 69, temperature 98.1 F (36.7 C), height 5\' 4"  (1.626 m), weight 269 lb (122 kg), SpO2 98 %. Body mass index is 46.17 kg/m.  General:  Cooperative, alert, well developed, in no acute distress. HEENT: Conjunctivae and lids unremarkable. Cardiovascular: Regular rhythm.  Lungs: Normal work of breathing. Neurologic: No focal deficits.   Lab Results  Component Value Date   CREATININE 0.93 05/24/2020   BUN 6 05/24/2020   NA 139 05/24/2020   K 4.1 05/24/2020   CL 100 05/24/2020   CO2 26 05/24/2020   Lab Results  Component Value Date   ALT 67 (H) 05/24/2020   AST 66 (H) 05/24/2020   ALKPHOS 79 05/24/2020   BILITOT 0.6 05/24/2020   Lab Results  Component Value Date   HGBA1C 6.4 (H) 05/24/2020   Lab Results  Component Value Date   INSULIN 18.0 05/24/2020   Lab Results  Component Value Date   TSH 0.70 08/24/2020   Lab Results  Component Value Date   CHOL 222 (H) 05/24/2020   HDL 63 05/24/2020   LDLCALC 137 (H) 05/24/2020   TRIG 126 05/24/2020   CHOLHDL 3.5 05/24/2020   Lab Results  Component Value Date   WBC 7.6 06/22/2020   HGB 14.1 06/22/2020   HCT 42.2 06/22/2020   MCV 92 06/22/2020   PLT 263 06/22/2020   Lab Results  Component Value Date   IRON 95 05/24/2020   TIBC 405 05/24/2020   FERRITIN 69 05/24/2020   Attestation Statements:   Reviewed by clinician on day of visit: allergies, medications, problem list, medical history, surgical history, family history, social history, and previous encounter notes.  Time spent on visit including pre-visit  chart review and post-visit care and charting was 30 minutes.    Trude Mcburney, am acting as transcriptionist for [employer], PA-C.  I have reviewed the above documentation for accuracy and completeness, and I agree with the above. Alois Cliche, PA-C

## 2020-10-04 ENCOUNTER — Other Ambulatory Visit (INDEPENDENT_AMBULATORY_CARE_PROVIDER_SITE_OTHER): Payer: Self-pay | Admitting: Family Medicine

## 2020-10-04 DIAGNOSIS — R632 Polyphagia: Secondary | ICD-10-CM

## 2020-10-04 MED ORDER — INSULIN PEN NEEDLE 32G X 4 MM MISC: 0 refills | Status: AC

## 2020-10-04 NOTE — Telephone Encounter (Signed)
Rx refill request

## 2020-10-04 NOTE — Telephone Encounter (Signed)
Kristi Curtis 

## 2020-10-06 ENCOUNTER — Other Ambulatory Visit (INDEPENDENT_AMBULATORY_CARE_PROVIDER_SITE_OTHER): Payer: Self-pay | Admitting: Family Medicine

## 2020-10-06 DIAGNOSIS — M2550 Pain in unspecified joint: Secondary | ICD-10-CM

## 2020-10-06 MED ORDER — MELOXICAM 15 MG PO TABS: 15.0000 mg | ORAL_TABLET | Freq: Every day | ORAL | 0 refills | Status: DC

## 2020-10-06 NOTE — Telephone Encounter (Signed)
Pt last seen by Tracey Aguilar, PA-C.  

## 2020-10-12 ENCOUNTER — Other Ambulatory Visit (INDEPENDENT_AMBULATORY_CARE_PROVIDER_SITE_OTHER): Payer: Self-pay | Admitting: Physician Assistant

## 2020-10-12 ENCOUNTER — Encounter (INDEPENDENT_AMBULATORY_CARE_PROVIDER_SITE_OTHER): Payer: Self-pay

## 2020-10-12 ENCOUNTER — Ambulatory Visit (INDEPENDENT_AMBULATORY_CARE_PROVIDER_SITE_OTHER): Payer: BC Managed Care – PPO | Admitting: Physician Assistant

## 2020-10-12 ENCOUNTER — Ambulatory Visit: Payer: BC Managed Care – PPO | Admitting: Internal Medicine

## 2020-10-12 DIAGNOSIS — R7303 Prediabetes: Secondary | ICD-10-CM

## 2020-10-12 DIAGNOSIS — E559 Vitamin D deficiency, unspecified: Secondary | ICD-10-CM

## 2020-10-12 DIAGNOSIS — R6889 Other general symptoms and signs: Secondary | ICD-10-CM | POA: Diagnosis not present

## 2020-10-12 DIAGNOSIS — E7800 Pure hypercholesterolemia, unspecified: Secondary | ICD-10-CM | POA: Diagnosis not present

## 2020-10-12 DIAGNOSIS — E039 Hypothyroidism, unspecified: Secondary | ICD-10-CM

## 2020-10-13 LAB — COMPREHENSIVE METABOLIC PANEL
ALT: 39 IU/L — ABNORMAL HIGH (ref 0–32)
AST: 33 IU/L (ref 0–40)
Albumin/Globulin Ratio: 1.4 (ref 1.2–2.2)
Albumin: 4.1 g/dL (ref 3.8–4.8)
Alkaline Phosphatase: 79 IU/L (ref 44–121)
BUN/Creatinine Ratio: 17 (ref 9–23)
BUN: 12 mg/dL (ref 6–24)
Bilirubin Total: 0.6 mg/dL (ref 0.0–1.2)
CO2: 21 mmol/L (ref 20–29)
Calcium: 9 mg/dL (ref 8.7–10.2)
Chloride: 104 mmol/L (ref 96–106)
Creatinine, Ser: 0.71 mg/dL (ref 0.57–1.00)
Globulin, Total: 3 g/dL (ref 1.5–4.5)
Glucose: 84 mg/dL (ref 65–99)
Potassium: 4.2 mmol/L (ref 3.5–5.2)
Sodium: 138 mmol/L (ref 134–144)
Total Protein: 7.1 g/dL (ref 6.0–8.5)
eGFR: 107 mL/min/{1.73_m2} (ref >59)

## 2020-10-13 LAB — THYROID PANEL WITH TSH
Free Thyroxine Index: 3.4 (ref 1.2–4.9)
T3 Uptake Ratio: 32 % (ref 24–39)
T4, Total: 10.6 ug/dL (ref 4.5–12.0)
TSH: 0.248 u[IU]/mL — ABNORMAL LOW (ref 0.450–4.500)

## 2020-10-13 LAB — LIPID PANEL
Chol/HDL Ratio: 2.6 ratio (ref 0.0–4.4)
Cholesterol, Total: 121 mg/dL (ref 100–199)
HDL: 46 mg/dL (ref >39)
LDL Chol Calc (NIH): 60 mg/dL (ref 0–99)
Triglycerides: 71 mg/dL (ref 0–149)
VLDL Cholesterol Cal: 15 mg/dL (ref 5–40)

## 2020-10-13 LAB — HEMOGLOBIN A1C
Est. average glucose Bld gHb Est-mCnc: 123 mg/dL
Hgb A1c MFr Bld: 5.9 % — ABNORMAL HIGH (ref 4.8–5.6)

## 2020-10-13 LAB — VITAMIN D 25 HYDROXY (VIT D DEFICIENCY, FRACTURES): Vit D, 25-Hydroxy: 8.9 ng/mL — ABNORMAL LOW (ref 30.0–100.0)

## 2020-10-13 LAB — INSULIN, RANDOM: INSULIN: 25.8 u[IU]/mL — ABNORMAL HIGH (ref 2.6–24.9)

## 2020-10-24 DIAGNOSIS — M25562 Pain in left knee: Secondary | ICD-10-CM | POA: Diagnosis not present

## 2020-10-28 ENCOUNTER — Other Ambulatory Visit: Payer: Self-pay | Admitting: Orthopaedic Surgery

## 2020-10-28 DIAGNOSIS — M25561 Pain in right knee: Secondary | ICD-10-CM

## 2020-11-11 ENCOUNTER — Ambulatory Visit
Admission: RE | Admit: 2020-11-11 | Discharge: 2020-11-11 | Disposition: A | Discharge status: Home / Self Care | Payer: BC Managed Care – PPO | Source: Ambulatory Visit | Attending: Orthopaedic Surgery | Admitting: Orthopaedic Surgery

## 2020-11-11 ENCOUNTER — Other Ambulatory Visit: Payer: Self-pay | Admitting: Orthopaedic Surgery

## 2020-11-11 DIAGNOSIS — M25561 Pain in right knee: Secondary | ICD-10-CM

## 2020-11-11 DIAGNOSIS — M25562 Pain in left knee: Secondary | ICD-10-CM | POA: Diagnosis not present

## 2020-11-23 DIAGNOSIS — M25562 Pain in left knee: Secondary | ICD-10-CM | POA: Diagnosis not present

## 2020-11-23 DIAGNOSIS — S83282A Other tear of lateral meniscus, current injury, left knee, initial encounter: Secondary | ICD-10-CM | POA: Diagnosis not present

## 2020-11-29 ENCOUNTER — Other Ambulatory Visit: Payer: Self-pay | Admitting: Internal Medicine

## 2020-12-14 ENCOUNTER — Other Ambulatory Visit: Payer: Self-pay

## 2020-12-14 ENCOUNTER — Encounter: Payer: Self-pay | Admitting: Internal Medicine

## 2020-12-14 ENCOUNTER — Telehealth: Payer: Self-pay | Admitting: Internal Medicine

## 2020-12-14 ENCOUNTER — Ambulatory Visit: Payer: BC Managed Care – PPO | Admitting: Internal Medicine

## 2020-12-14 VITALS — BP 144/84 | HR 98 | Ht 64.0 in | Wt 273.2 lb

## 2020-12-14 DIAGNOSIS — E039 Hypothyroidism, unspecified: Secondary | ICD-10-CM | POA: Diagnosis not present

## 2020-12-14 DIAGNOSIS — R635 Abnormal weight gain: Secondary | ICD-10-CM

## 2020-12-14 NOTE — Progress Notes (Signed)
Name: Kristi Curtis  MRN/ DOB: [id number], May 21, 1976    Age/ Sex: 45 y.o., female     PCP: Dema Severin, NP   Reason for Endocrinology Evaluation: Hypothyroidism     Initial Endocrinology Clinic Visit: 07/11/2020    PATIENT IDENTIFIER: Kristi Curtis is a 45 y.o., female with a past medical history of Hypothyroidism. She has followed with [facility] since 12/15/2020 for consultative assistance with management of her Hypothyroidism.   HISTORICAL SUMMARY:   During evaluation for obesity through the [facility] healthy weight and wellness she was noted with a TSH of 226 uIU/mL     She was diagnosed with hypothyroidism since the age 45. Has been on LT-4 replacement  Has 2 kids 32 and 7  Mother with hx of thyroid disease  SUBJECTIVE:     Today (12/14/2020):  Kristi Curtis is here for a follow up on hypothyroidism.    Weight has been stable  Denies constipation or diarrhea  Denies palpitations  Denies hand tremors  Denies local neck symptoms    LMP 7/26  Levothyroxine 200 mcg daily   HISTORY:  Past Medical History:  Past Medical History:  Diagnosis Date   Body aches    Fatigue    Hypertension    Hypothyroidism    Lower extremity edema    SOB (shortness of breath)    Thyroid disease    Past Surgical History:  Past Surgical History:  Procedure Laterality Date   CESAREAN SECTION     CHOLECYSTECTOMY     LIPOSUCTION     LIPOSUCTION     Social History:  reports that she has never smoked. She has never used smokeless tobacco. She reports that she does not drink alcohol and does not use drugs. Family History:  Family History  Problem Relation Age of Onset   Hypertension Mother    Hypertension Father      HOME MEDICATIONS: Allergies as of 12/14/2020   No Known Allergies      Medication List        Accurate as of December 14, 2020  4:08 PM. If you have any questions, ask your nurse or doctor.          Insulin Pen Needle 32G X 4 MM Misc Use daily  with saxenda   meloxicam 15 MG tablet Commonly known as: MOBIC Take 1 tablet (15 mg total) by mouth daily.   Saxenda 18 MG/3ML Sopn Generic drug: Liraglutide -Weight Management Inject 3 mg into the skin.   Synthroid 200 MCG tablet Generic drug: levothyroxine TAKE 1 TABLET (200 MCG TOTAL) BY MOUTH DAILY BEFORE BREAKFAST.          OBJECTIVE:   PHYSICAL EXAM: VS: BP (!) 144/84   Pulse 98   Ht 5\' 4"  (1.626 m)   Wt 273 lb 3.2 oz (123.9 kg)   SpO2 (!) 84%   BMI 46.89 kg/m    EXAM: General: Pt appears well and is in NAD  Neck: General: Supple without adenopathy. Thyroid: Thyroid size normal.  No goiter or nodules appreciated. No thyroid bruit.  Lungs: Clear with good BS bilat with no rales, rhonchi, or wheezes  Heart: Auscultation: RRR.  Abdomen: Normoactive bowel sounds, soft, nontender, without masses or organomegaly palpable  Extremities:  BL LE: No pretibial edema normal ROM and strength.  Mental Status: Judgment, insight: Intact Orientation: Oriented to time, place, and person Memory: Intact for recent and remote events Mood and affect: No depression, anxiety, or agitation  DATA REVIEWED: Results for XAN, SPARKMAN (MRN [id number]) as of 12/15/2020 13:04  Ref. Range 06/22/2020 15:40 08/24/2020 08:14 10/12/2020 11:39 12/14/2020 16:30  TSH Latest Ref Range: 0.35 - 5.50 uIU/mL 13.900 (H) 0.70 0.248 (L) 1.25      ASSESSMENT / PLAN / RECOMMENDATIONS:   Hypothyroidism :  -Patient is clinically euthyroid -No local neck symptoms -She is taking Synthroid appropriately -TSH is normal, no changes   Medications   Continue Synthroid 200 MCG daily    2.  Weight gain:  -She is currently on Saxenda for weight loss, insurance is not paying for this, but she is able to obtain it -We will screen for Cushing syndrome    Follow-up in 4 months Labs in 8 weeks Signed electronically by: Lyndle Herrlich, MD  [facility] Endocrinology  [facility] [address] [place] [place], [place] [address] Phone: [phone] FAX: [phone]      CC: Dema Severin, NP [address] [facility] [place] [address] Phone: [phone]  Fax: [phone]   Return to Endocrinology clinic as below: No future appointments.

## 2020-12-14 NOTE — Patient Instructions (Signed)

## 2020-12-14 NOTE — Telephone Encounter (Signed)
Pt was seen on 12/14/2020 calling in voiced that she would like her medications to go to [facility] in [place].

## 2020-12-15 ENCOUNTER — Other Ambulatory Visit: Payer: Self-pay

## 2020-12-15 ENCOUNTER — Encounter: Payer: Self-pay | Admitting: Internal Medicine

## 2020-12-15 ENCOUNTER — Telehealth: Payer: Self-pay | Admitting: Internal Medicine

## 2020-12-15 DIAGNOSIS — E039 Hypothyroidism, unspecified: Secondary | ICD-10-CM

## 2020-12-15 LAB — TSH: TSH: 1.25 u[IU]/mL (ref 0.35–5.50)

## 2020-12-15 MED ORDER — SYNTHROID 200 MCG PO TABS: 200.0000 ug | ORAL_TABLET | Freq: Every day | ORAL | 3 refills | Status: DC

## 2020-12-15 MED ORDER — SYNTHROID 200 MCG PO TABS
200.0000 ug | ORAL_TABLET | Freq: Every day | ORAL | 3 refills | Status: DC
Start: 2020-12-15 — End: 2021-12-25

## 2020-12-15 NOTE — Telephone Encounter (Signed)
Okay; thanks.

## 2020-12-15 NOTE — Telephone Encounter (Signed)
Harriett Sine from [facility] is wondering if the patient has tried the generic first before she has tried the the name brand pt work requires her to try the generic first before the name brand

## 2020-12-16 NOTE — Telephone Encounter (Signed)
Spoken to [facility]. Review the patient's chart. It shows that patient had tried Levothyroxine and levels are stable on brand name Synthroid

## 2020-12-19 ENCOUNTER — Encounter: Payer: Self-pay | Admitting: Internal Medicine

## 2020-12-20 ENCOUNTER — Telehealth: Payer: Self-pay | Admitting: Pharmacy Technician

## 2020-12-20 NOTE — Telephone Encounter (Signed)
Prior authorization not needed.  Pharmacy could fill brand Synthroid one-time, and is ready for pick up today.  Spoke with patient, she states doctor will need to send letter (she messaged about this) to get brand for future fills.  Melida Quitter, CMA, notified.  Netty Starring. Dimas Aguas, CPhT [facility] Phone: [phone] Fax:  [phone]

## 2020-12-20 NOTE — Telephone Encounter (Signed)
[facility] Encounter   Received notification from [employer] that prior authorization for SYNTHROID 200 MCG is required.   PA submitted on 12/20/2020 Key [id number] Status is pending    [facility] will continue to follow   Jeannette How, CPhT [facility] Phone: [phone] Fax:  [phone]

## 2020-12-21 NOTE — Telephone Encounter (Signed)
Kristi Curtis ,  Can you look in to doing a PA for this patient , thanks

## 2020-12-29 DIAGNOSIS — G8918 Other acute postprocedural pain: Secondary | ICD-10-CM | POA: Diagnosis not present

## 2020-12-29 DIAGNOSIS — M948X6 Other specified disorders of cartilage, lower leg: Secondary | ICD-10-CM | POA: Diagnosis not present

## 2020-12-29 DIAGNOSIS — S83272A Complex tear of lateral meniscus, current injury, left knee, initial encounter: Secondary | ICD-10-CM | POA: Diagnosis not present

## 2020-12-29 DIAGNOSIS — M1712 Unilateral primary osteoarthritis, left knee: Secondary | ICD-10-CM | POA: Diagnosis not present

## 2020-12-29 DIAGNOSIS — X58XXXA Exposure to other specified factors, initial encounter: Secondary | ICD-10-CM | POA: Diagnosis not present

## 2020-12-29 DIAGNOSIS — Y999 Unspecified external cause status: Secondary | ICD-10-CM | POA: Diagnosis not present

## 2021-01-04 DIAGNOSIS — R531 Weakness: Secondary | ICD-10-CM | POA: Diagnosis not present

## 2021-01-04 DIAGNOSIS — M25662 Stiffness of left knee, not elsewhere classified: Secondary | ICD-10-CM | POA: Diagnosis not present

## 2021-01-09 DIAGNOSIS — R531 Weakness: Secondary | ICD-10-CM | POA: Diagnosis not present

## 2021-01-09 DIAGNOSIS — M25662 Stiffness of left knee, not elsewhere classified: Secondary | ICD-10-CM | POA: Diagnosis not present

## 2021-02-10 ENCOUNTER — Other Ambulatory Visit: Payer: BC Managed Care – PPO

## 2021-02-16 ENCOUNTER — Other Ambulatory Visit: Payer: Self-pay

## 2021-02-16 ENCOUNTER — Other Ambulatory Visit (INDEPENDENT_AMBULATORY_CARE_PROVIDER_SITE_OTHER): Payer: BC Managed Care – PPO

## 2021-02-16 DIAGNOSIS — E039 Hypothyroidism, unspecified: Secondary | ICD-10-CM

## 2021-02-16 LAB — TSH: TSH: 0.18 u[IU]/mL — ABNORMAL LOW (ref 0.35–5.50)

## 2021-04-12 ENCOUNTER — Ambulatory Visit: Payer: BC Managed Care – PPO | Admitting: Internal Medicine

## 2021-04-12 NOTE — Progress Notes (Deleted)
Name: Kristi Curtis  MRN/ DOB: [id number], May 09, 1976    Age/ Sex: 45 y.o., female     PCP: Kristi Severin, NP   Reason for Endocrinology Evaluation: Hypothyroidism     Initial Endocrinology Clinic Visit: 07/11/2020    PATIENT IDENTIFIER: Kristi Curtis is a 45 y.o., female with a past medical history of Hypothyroidism. She has followed with  Endocrinology clinic since 12/15/2020 for consultative assistance with management of her Hypothyroidism.     HISTORICAL SUMMARY:   During evaluation for obesity through the [facility] healthy weight and wellness she was noted with a TSH of 226 uIU/mL     She was diagnosed with hypothyroidism since the age 16. Has been on LT-4 replacement  Has 2 kids 38 and 7  Mother with hx of thyroid disease  SUBJECTIVE:     Today (04/12/2021):  Kristi Curtis is here for a follow up on hypothyroidism.    Weight has been stable  Denies constipation or diarrhea  Denies palpitations  Denies hand tremors  Denies local neck symptoms    LMP 7/26  Levothyroxine 200 mcg daily   HISTORY:  Past Medical History:  Past Medical History:  Diagnosis Date   Body aches    Fatigue    Hypertension    Hypothyroidism    Lower extremity edema    SOB (shortness of breath)    Thyroid disease    Past Surgical History:  Past Surgical History:  Procedure Laterality Date   CESAREAN SECTION     CHOLECYSTECTOMY     LIPOSUCTION     LIPOSUCTION     Social History:  reports that she has never smoked. She has never used smokeless tobacco. She reports that she does not drink alcohol and does not use drugs. Family History:  Family History  Problem Relation Age of Onset   Hypertension Mother    Hypertension Father      HOME MEDICATIONS: Allergies as of 04/12/2021   No Known Allergies      Medication List        Accurate as of April 12, 2021 12:31 PM. If you have any questions, ask your nurse or doctor.          Insulin Pen Needle 32G X 4 MM  Misc Use daily with saxenda   meloxicam 15 MG tablet Commonly known as: MOBIC Take 1 tablet (15 mg total) by mouth daily.   Saxenda 18 MG/3ML Sopn Generic drug: Liraglutide -Weight Management Inject 3 mg into the skin.   Synthroid 200 MCG tablet Generic drug: levothyroxine Take 1 tablet (200 mcg total) by mouth daily before breakfast.          OBJECTIVE:   PHYSICAL EXAM: VS: There were no vitals taken for this visit.   EXAM: General: Pt appears well and is in NAD  Neck: General: Supple without adenopathy. Thyroid: Thyroid size normal.  No goiter or nodules appreciated. No thyroid bruit.  Lungs: Clear with good BS bilat with no rales, rhonchi, or wheezes  Heart: Auscultation: RRR.  Abdomen: Normoactive bowel sounds, soft, nontender, without masses or organomegaly palpable  Extremities:  BL LE: No pretibial edema normal ROM and strength.  Mental Status: Judgment, insight: Intact Orientation: Oriented to time, place, and person Memory: Intact for recent and remote events Mood and affect: No depression, anxiety, or agitation     DATA REVIEWED: Results for Kristi, Curtis (MRN [id number]) as of 12/15/2020 13:04  Ref. Range 06/22/2020 15:40 08/24/2020 08:14 10/12/2020 11:39 12/14/2020 16:30  TSH Latest Ref Range: 0.35 - 5.50 uIU/mL 13.900 (H) 0.70 0.248 (L) 1.25      ASSESSMENT / PLAN / RECOMMENDATIONS:   Hypothyroidism :  -Patient is clinically euthyroid -No local neck symptoms -She is taking Synthroid appropriately -TSH is normal, no changes   Medications   Continue Synthroid 200 MCG daily    2.  Weight gain:  -She is currently on Saxenda for weight loss, insurance is not paying for this, but she is able to obtain it -We will screen for Cushing syndrome    Follow-up in 4 months Labs in 8 weeks Signed electronically by: Kristi Herrlich, MD  [facility]  [facility] [address] [place] [place], [place] [address] Phone:  [phone] FAX: [phone]      CC: Kristi Severin, NP [address] [facility] [place] [address] Phone: [phone]  Fax: [phone]   Return to Endocrinology clinic as below: Future Appointments  Date Time Provider Department Center  04/12/2021  3:40 PM Kristi Curtis, Kristi Dolores, MD [facility] None

## 2021-12-25 ENCOUNTER — Other Ambulatory Visit: Payer: Self-pay | Admitting: Internal Medicine

## 2021-12-25 DIAGNOSIS — E039 Hypothyroidism, unspecified: Secondary | ICD-10-CM

## 2021-12-27 ENCOUNTER — Encounter (INDEPENDENT_AMBULATORY_CARE_PROVIDER_SITE_OTHER): Payer: Self-pay

## 2022-01-17 IMAGING — MR MR KNEE*L* W/O CM
4 of 6 series · 23 of 40 positions shown · non-contrast
Comparison: Left knee x-rays dated October 02, 2019.

CLINICAL DATA: Chronic left knee pain.  No prior surgery.

EXAM:
MRI OF THE LEFT KNEE WITHOUT CONTRAST
TECHNIQUE: Multiplanar, multisequence MR imaging of the knee was performed. No
intravenous contrast was administered.

[Series 4: T2 fat-sat · coronal · 4.0mm · 0.62mm/px · 6 of 29 slices shown (1 of 2)]
[im 1/29]
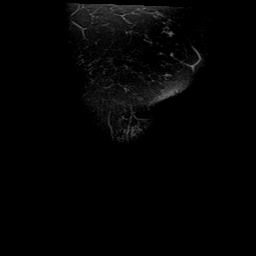
[im 6/29]
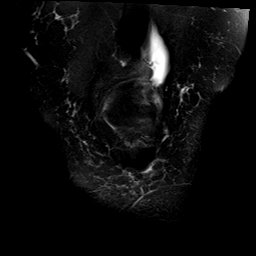
[im 12/29]
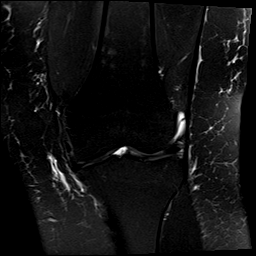
[im 17/29]
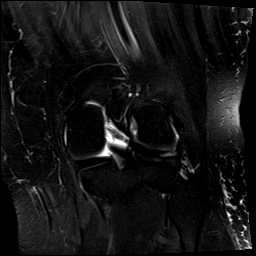
[im 23/29]
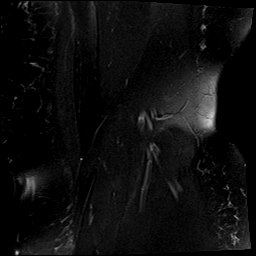
[im 29/29]
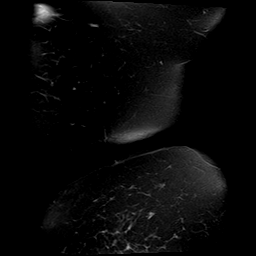

[Series 5: T1 · coronal · 4.0mm · 0.31mm/px · 3 of 29 slices shown]
[im 6/29]
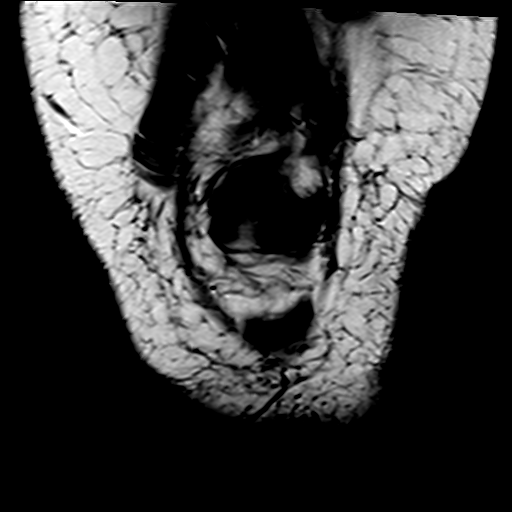
[im 17/29]
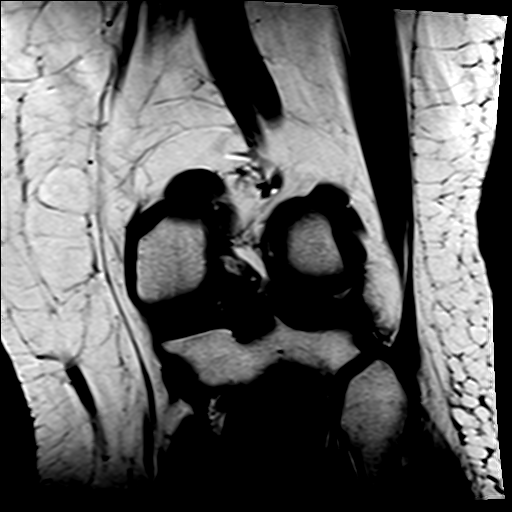
[im 29/29]
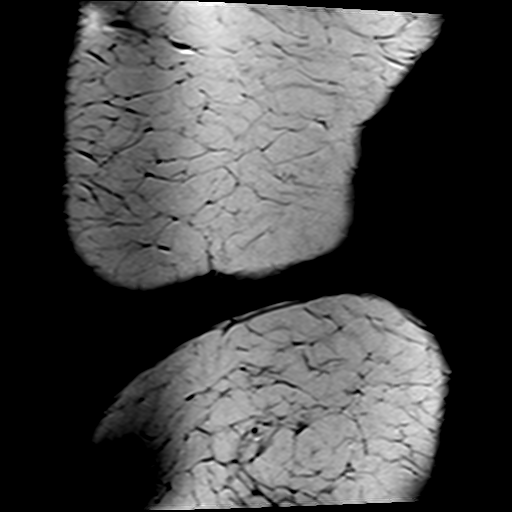

[Series 7: PD fat-sat · sagittal · 3.0mm · 0.31mm/px · 7 of 32 slices shown]
[im 1/32]
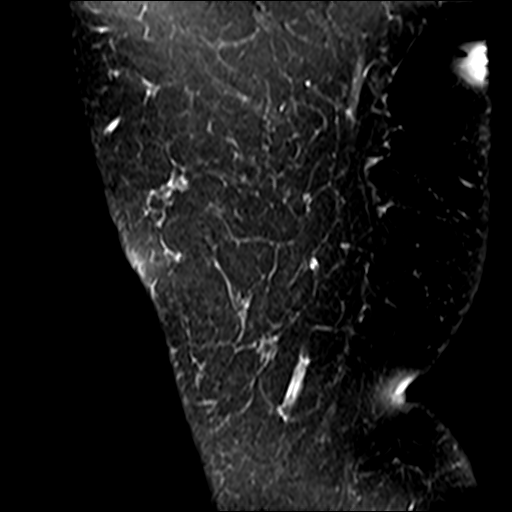
[im 6/32]
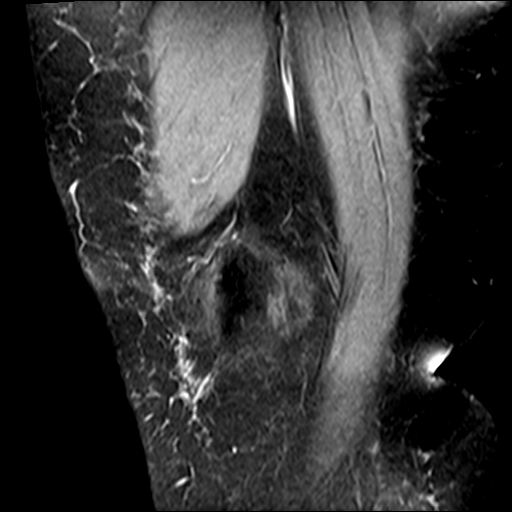
[im 11/32]
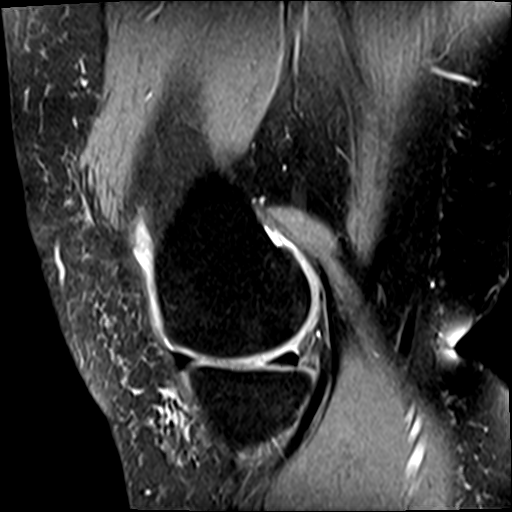
[im 16/32]
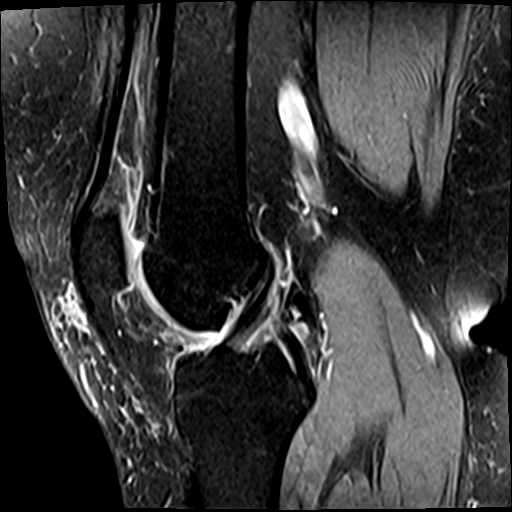
[im 21/32]
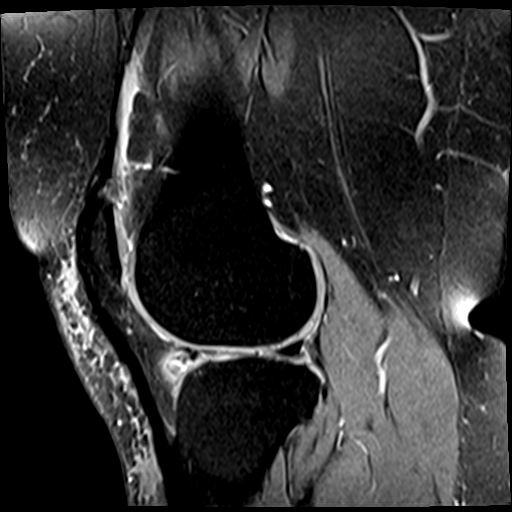
[im 26/32]
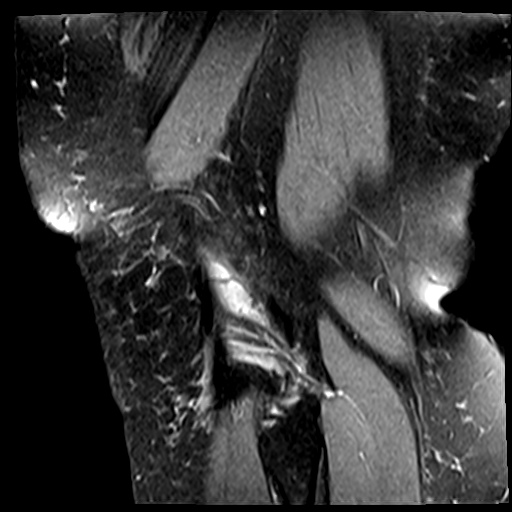
[im 32/32]
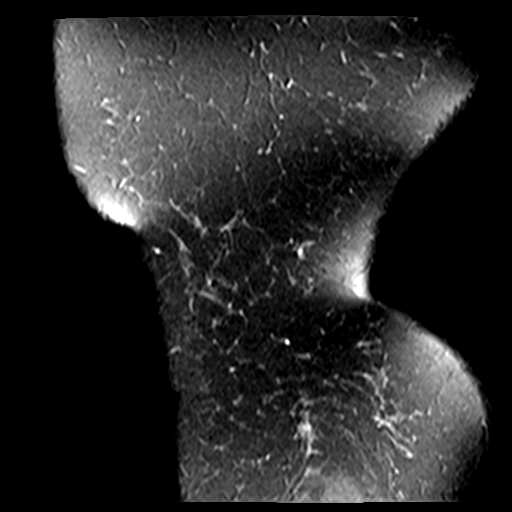

[Series 8: T2 fat-sat · sagittal · 3.0mm · 0.31mm/px · 7 of 32 slices shown (2 of 2)]
[im 1/32]
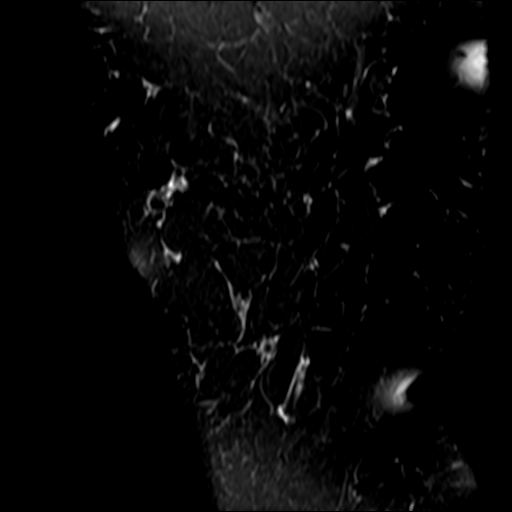
[im 6/32]
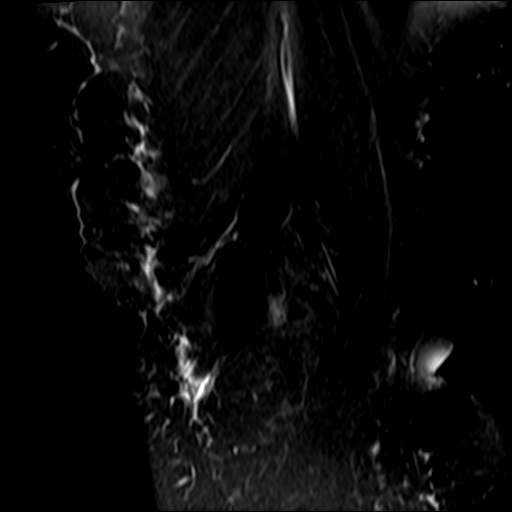
[im 11/32]
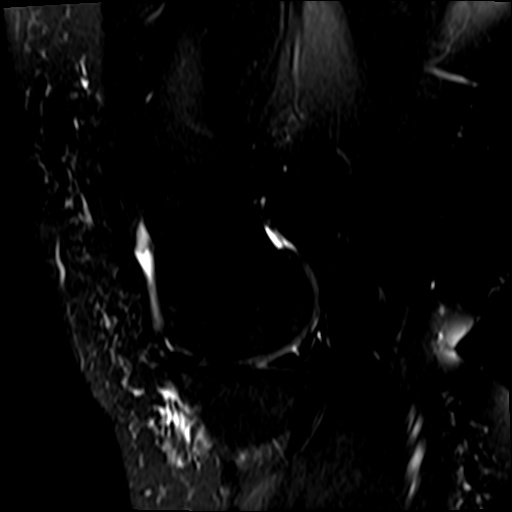
[im 16/32]
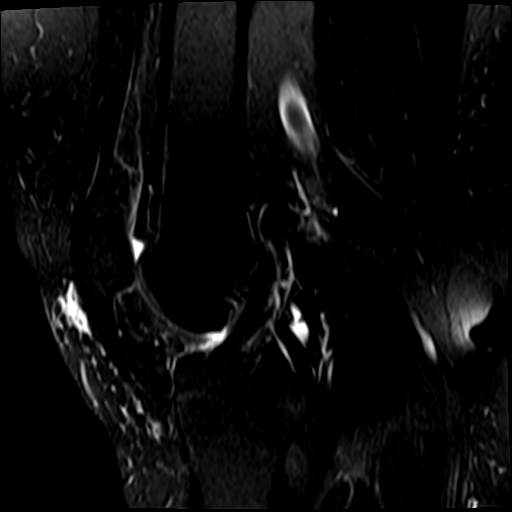
[im 21/32]
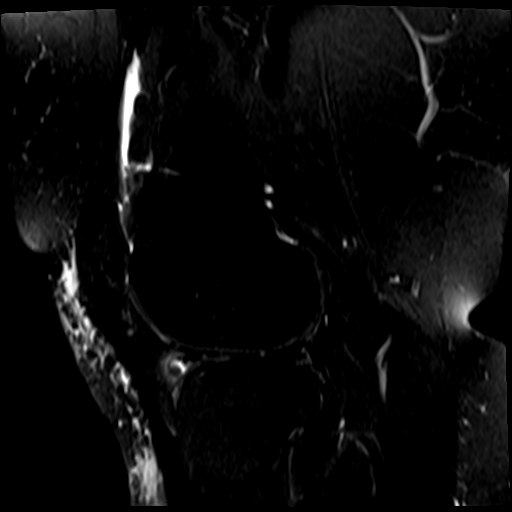
[im 26/32]
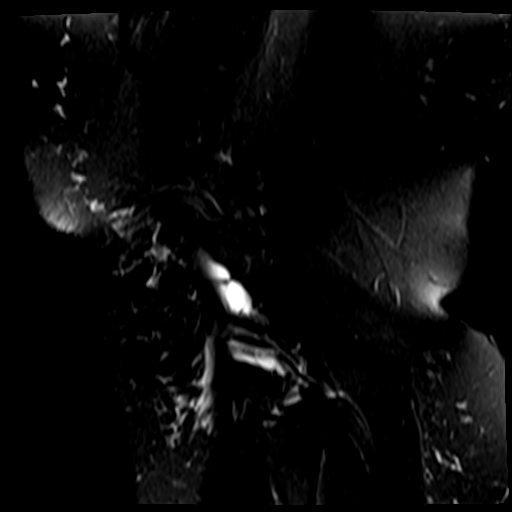
[im 32/32]
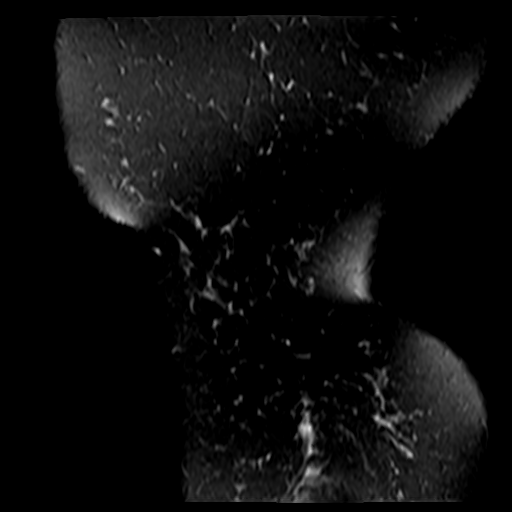

[23 of 40 positions shown; findings below may reference images not displayed]

FINDINGS: MENISCI

Medial meniscus:  Intact.

Lateral meniscus:  Large radial tear of the anterior horn.

LIGAMENTS

Cruciates:  Intact ACL and PCL.

Collaterals: Medial collateral ligament is intact. Lateral
collateral ligament complex is intact.

CARTILAGE

Patellofemoral:  Mild partial-thickness cartilage loss.

Medial: Early degeneration and superficial irregularity along the
mesial aspect of the medial femoral condyle. No chondral defect.

Lateral: Mild partial-thickness cartilage loss over the lateral
tibial plateau.

Joint:  Small joint effusion.  Edema within Hoffa's fat.

Popliteal Fossa:  No Baker cyst. Intact popliteus tendon.

Extensor Mechanism: Intact quadriceps tendon and patellar tendon.
Intact medial and lateral patellar retinaculum. Intact MPFL.

Bones: No acute fracture or dislocation. No suspicious bone lesion.

Other: None.
IMPRESSION: 1. Large radial tear of the lateral meniscus anterior horn.
2. Mild tricompartmental osteoarthritis.
3. Small joint effusion.

## 2022-03-26 ENCOUNTER — Other Ambulatory Visit: Payer: Self-pay | Admitting: Internal Medicine

## 2022-03-26 DIAGNOSIS — E039 Hypothyroidism, unspecified: Secondary | ICD-10-CM

## 2022-06-21 ENCOUNTER — Other Ambulatory Visit: Payer: Self-pay | Admitting: Internal Medicine

## 2022-06-21 DIAGNOSIS — E039 Hypothyroidism, unspecified: Secondary | ICD-10-CM

## 2022-06-25 ENCOUNTER — Encounter: Payer: Self-pay | Admitting: Internal Medicine

## 2022-06-25 ENCOUNTER — Ambulatory Visit: Payer: BC Managed Care – PPO | Admitting: Internal Medicine

## 2022-06-25 VITALS — BP 124/82 | HR 84 | Ht 64.0 in | Wt 273.0 lb

## 2022-06-25 DIAGNOSIS — E039 Hypothyroidism, unspecified: Secondary | ICD-10-CM

## 2022-06-25 LAB — TSH: TSH: 0.07 u[IU]/mL — ABNORMAL LOW (ref 0.35–5.50)

## 2022-06-25 MED ORDER — SYNTHROID 175 MCG PO TABS: 175.0000 ug | ORAL_TABLET | Freq: Every day | ORAL | 3 refills | Status: DC

## 2022-06-25 NOTE — Progress Notes (Signed)
Name: Kristi Curtis  MRN/ DOB: [id number], 1976/03/11    Age/ Sex: 47 y.o., female     PCP: Rhea Bleacher, NP   Reason for Endocrinology Evaluation: Hypothyroidism     Initial Endocrinology Clinic Visit: 07/11/2020    PATIENT IDENTIFIER: Kristi Curtis is a 47 y.o., female with a past medical history of Hypothyroidism. She has followed with [facility] since 12/15/2020 for consultative assistance with management of her Hypothyroidism.   HISTORICAL SUMMARY:   During evaluation for obesity through the [facility] healthy weight and wellness she was noted with a TSH of 226 uIU/mL     She was diagnosed with hypothyroidism since the age 43. Has been on LT-4 replacement  Has 2 kids 64 and 7  Mother with hx of thyroid disease  SUBJECTIVE:     Today (06/25/2022):  Kristi Curtis is here for a follow up on hypothyroidism.    She had COVID ~2 weeks ago , mild symptoms    Weight continues to fluctuate  Denies constipation or diarrhea  Denies palpitations  Denies hand tremors  Denies local neck symptoms  No Biotin  She has been tired  LE swelling resolved with dietary modifications   She has been taking Synthroid  200 mcg ,1 tablet daily instead of half on Sundays   HISTORY:  Past Medical History:  Past Medical History:  Diagnosis Date   Body aches    Fatigue    Hypertension    Hypothyroidism    Lower extremity edema    SOB (shortness of breath)    Thyroid disease    Past Surgical History:  Past Surgical History:  Procedure Laterality Date   CESAREAN SECTION     CHOLECYSTECTOMY     LIPOSUCTION     LIPOSUCTION     Social History:  reports that she has never smoked. She has never used smokeless tobacco. She reports that she does not drink alcohol and does not use drugs. Family History:  Family History  Problem Relation Age of Onset   Hypertension Mother    Hypertension Father      HOME MEDICATIONS: Allergies as of 06/25/2022   No Known Allergies       Medication List        Accurate as of June 25, 2022  2:38 PM. If you have any questions, ask your nurse or doctor.          STOP taking these medications    levothyroxine 200 MCG tablet Commonly known as: Synthroid Replaced by: Synthroid 175 MCG tablet Stopped by: Dorita Sciara, MD   meloxicam 15 MG tablet Commonly known as: MOBIC Stopped by: Dorita Sciara, MD   Saxenda 18 MG/3ML Sopn Generic drug: Liraglutide -Weight Management Stopped by: Dorita Sciara, MD       TAKE these medications    Insulin Pen Needle 32G X 4 MM Misc Use daily with saxenda   metFORMIN 500 MG 24 hr tablet Commonly known as: GLUCOPHAGE-XR Take 500 mg by mouth daily before breakfast.   Synthroid 175 MCG tablet Generic drug: levothyroxine Take 1 tablet (175 mcg total) by mouth daily before breakfast. Replaces: levothyroxine 200 MCG tablet Started by: Dorita Sciara, MD          OBJECTIVE:   PHYSICAL EXAM: VS: BP 124/82 (BP Location: Left Arm, Patient Position: Sitting, Cuff Size: Large)   Pulse 84   Ht 5\' 4"  (1.626 m)   Wt 273 lb (123.8 kg)   SpO2  99%   BMI 46.86 kg/m    EXAM: General: Pt appears well and is in NAD  Neck: General: Supple without adenopathy. Thyroid: Thyroid size normal.  No goiter or nodules appreciated.   Lungs: Clear with good BS bilat   Heart: Auscultation: RRR.  Extremities:  BL LE: No pretibial edema normal ROM and strength.  Mental Status: Judgment, insight: Intact Mood and affect: No depression, anxiety, or agitation     DATA REVIEWED:  Latest Reference Range & Units 02/16/21 08:10 06/25/22 11:14  TSH 0.35 - 5.50 uIU/mL 0.18 (L) 0.07 (L)  (L): Data is abnormally low   ASSESSMENT / PLAN / RECOMMENDATIONS:   Hypothyroidism :  -Patient is clinically euthyroid -No local neck symptoms -She is taking Synthroid appropriately -TSH continues to be low, will decrease as below   Medications   Stop Synthroid 200  MCG daily Start Synthroid 175 mcg daily    Signed electronically by: Mack Guise, MD  [facility] Endocrinology  [facility] [address] [place], [place] [address] Phone: [phone] FAX: [phone]      CC: Rhea Bleacher, NP [place] [address] Phone: [phone]  Fax: [phone]   Return to Endocrinology clinic as below: Future Appointments  Date Time Provider [place]  06/26/2023 11:10 AM Artavia Jeanlouis, Melanie Crazier, MD [facility] None

## 2022-07-03 ENCOUNTER — Ambulatory Visit: Payer: BC Managed Care – PPO | Admitting: Internal Medicine

## 2022-07-18 ENCOUNTER — Other Ambulatory Visit: Payer: Self-pay | Admitting: Family Medicine

## 2022-07-18 ENCOUNTER — Ambulatory Visit
Admission: RE | Admit: 2022-07-18 | Discharge: 2022-07-18 | Disposition: A | Discharge status: Home / Self Care | Payer: BC Managed Care – PPO | Source: Ambulatory Visit | Attending: Family Medicine | Admitting: Family Medicine

## 2022-07-18 DIAGNOSIS — R202 Paresthesia of skin: Secondary | ICD-10-CM | POA: Diagnosis not present

## 2022-07-18 DIAGNOSIS — M25512 Pain in left shoulder: Secondary | ICD-10-CM | POA: Diagnosis not present

## 2022-07-18 DIAGNOSIS — E559 Vitamin D deficiency, unspecified: Secondary | ICD-10-CM | POA: Diagnosis not present

## 2022-07-18 DIAGNOSIS — I10 Essential (primary) hypertension: Secondary | ICD-10-CM | POA: Diagnosis not present

## 2022-07-18 DIAGNOSIS — R200 Anesthesia of skin: Secondary | ICD-10-CM | POA: Diagnosis not present

## 2022-07-18 DIAGNOSIS — E039 Hypothyroidism, unspecified: Secondary | ICD-10-CM | POA: Diagnosis not present

## 2022-08-22 DIAGNOSIS — Z01419 Encounter for gynecological examination (general) (routine) without abnormal findings: Secondary | ICD-10-CM | POA: Diagnosis not present

## 2022-08-22 DIAGNOSIS — Z124 Encounter for screening for malignant neoplasm of cervix: Secondary | ICD-10-CM | POA: Diagnosis not present

## 2022-08-22 DIAGNOSIS — Z1151 Encounter for screening for human papillomavirus (HPV): Secondary | ICD-10-CM | POA: Diagnosis not present

## 2022-08-22 DIAGNOSIS — Z6841 Body Mass Index (BMI) 40.0 and over, adult: Secondary | ICD-10-CM | POA: Diagnosis not present

## 2022-08-22 DIAGNOSIS — Z1231 Encounter for screening mammogram for malignant neoplasm of breast: Secondary | ICD-10-CM | POA: Diagnosis not present

## 2022-08-27 ENCOUNTER — Other Ambulatory Visit: Payer: Self-pay | Admitting: Obstetrics and Gynecology

## 2022-08-27 DIAGNOSIS — R928 Other abnormal and inconclusive findings on diagnostic imaging of breast: Secondary | ICD-10-CM

## 2022-09-06 ENCOUNTER — Ambulatory Visit
Admission: RE | Admit: 2022-09-06 | Discharge: 2022-09-06 | Disposition: A | Discharge status: Home / Self Care | Payer: BC Managed Care – PPO | Source: Ambulatory Visit | Attending: Obstetrics and Gynecology | Admitting: Obstetrics and Gynecology

## 2022-09-06 DIAGNOSIS — R928 Other abnormal and inconclusive findings on diagnostic imaging of breast: Secondary | ICD-10-CM | POA: Diagnosis not present

## 2022-09-06 DIAGNOSIS — R921 Mammographic calcification found on diagnostic imaging of breast: Secondary | ICD-10-CM | POA: Diagnosis not present

## 2022-09-06 DIAGNOSIS — N6489 Other specified disorders of breast: Secondary | ICD-10-CM | POA: Diagnosis not present

## 2022-09-07 ENCOUNTER — Other Ambulatory Visit: Payer: Self-pay | Admitting: Obstetrics and Gynecology

## 2022-09-07 DIAGNOSIS — R921 Mammographic calcification found on diagnostic imaging of breast: Secondary | ICD-10-CM

## 2022-09-07 DIAGNOSIS — N6489 Other specified disorders of breast: Secondary | ICD-10-CM

## 2022-09-14 ENCOUNTER — Ambulatory Visit
Admission: RE | Admit: 2022-09-14 | Discharge: 2022-09-14 | Disposition: A | Discharge status: Home / Self Care | Payer: BC Managed Care – PPO | Source: Ambulatory Visit | Attending: Obstetrics and Gynecology | Admitting: Obstetrics and Gynecology

## 2022-09-14 DIAGNOSIS — N6489 Other specified disorders of breast: Secondary | ICD-10-CM

## 2022-09-14 DIAGNOSIS — R921 Mammographic calcification found on diagnostic imaging of breast: Secondary | ICD-10-CM

## 2022-09-14 DIAGNOSIS — R928 Other abnormal and inconclusive findings on diagnostic imaging of breast: Secondary | ICD-10-CM | POA: Diagnosis not present

## 2022-09-14 HISTORY — PX: BREAST BIOPSY: SHX20

## 2022-10-04 ENCOUNTER — Other Ambulatory Visit: Payer: Self-pay | Admitting: Family Medicine

## 2022-10-04 DIAGNOSIS — Z01818 Encounter for other preprocedural examination: Secondary | ICD-10-CM | POA: Diagnosis not present

## 2022-10-04 DIAGNOSIS — Z6841 Body Mass Index (BMI) 40.0 and over, adult: Secondary | ICD-10-CM | POA: Diagnosis not present

## 2022-10-04 DIAGNOSIS — E039 Hypothyroidism, unspecified: Secondary | ICD-10-CM | POA: Diagnosis not present

## 2022-10-04 DIAGNOSIS — E559 Vitamin D deficiency, unspecified: Secondary | ICD-10-CM | POA: Diagnosis not present

## 2022-10-08 ENCOUNTER — Ambulatory Visit
Admission: RE | Admit: 2022-10-08 | Discharge: 2022-10-08 | Disposition: A | Discharge status: Home / Self Care | Payer: BC Managed Care – PPO | Source: Ambulatory Visit | Attending: Family Medicine | Admitting: Family Medicine

## 2022-10-08 ENCOUNTER — Other Ambulatory Visit: Payer: Self-pay | Admitting: Family Medicine

## 2022-10-08 DIAGNOSIS — Z01818 Encounter for other preprocedural examination: Secondary | ICD-10-CM

## 2022-10-08 DIAGNOSIS — Z01810 Encounter for preprocedural cardiovascular examination: Secondary | ICD-10-CM | POA: Diagnosis not present

## 2022-10-11 DIAGNOSIS — Z01818 Encounter for other preprocedural examination: Secondary | ICD-10-CM | POA: Diagnosis not present

## 2022-10-16 ENCOUNTER — Ambulatory Visit (HOSPITAL_BASED_OUTPATIENT_CLINIC_OR_DEPARTMENT_OTHER): Payer: BC Managed Care – PPO | Admitting: Pulmonary Disease

## 2022-10-16 ENCOUNTER — Encounter (HOSPITAL_BASED_OUTPATIENT_CLINIC_OR_DEPARTMENT_OTHER): Payer: Self-pay | Admitting: Pulmonary Disease

## 2022-10-16 VITALS — BP 136/96 | HR 61 | Temp 98.5°F | Ht 63.0 in | Wt 284.4 lb

## 2022-10-16 DIAGNOSIS — R0683 Snoring: Secondary | ICD-10-CM

## 2022-10-16 DIAGNOSIS — Z01811 Encounter for preprocedural respiratory examination: Secondary | ICD-10-CM | POA: Diagnosis not present

## 2022-10-16 DIAGNOSIS — Z6841 Body Mass Index (BMI) 40.0 and over, adult: Secondary | ICD-10-CM

## 2022-10-16 NOTE — Progress Notes (Signed)
[facility]  Chief Complaint  Patient presents with   Consult    Consult. Patient is here for surgical clearance.     Past Surgical History:  She  has a past surgical history that includes Cholecystectomy; Cesarean section; Liposuction; Liposuction; Breast biopsy (Right, 09/14/2022); and Breast biopsy (Right, 09/14/2022).  Past Medical History:  HTN, Hypothyroidism  Constitutional:  BP (!) 136/96 (BP Location: Right Arm, Patient Position: Sitting, Cuff Size: Large)   Pulse 61   Temp 98.5 F (36.9 C) (Oral)   Ht 5\' 3"  (1.6 m)   Wt 284 lb 6.4 oz (129 kg)   SpO2 99%   BMI 50.38 kg/m   Brief Summary:  Kristi Curtis is a 47 y.o. female for pre-operative respiratory assessment.      Subjective:   She is being set up to have bariatric surgery in June 2024.  This will be done in [place].  As part of the pre-operative process she was advised she needed pulmonary assessment.  She does not smoke cigarettes.  She does not having cough, wheeze, chest congestion, or chest discomfort.  No history of asthma or COPD.  She doesn't feel like her breathing limits her activity.  She snores sometimes at night.  She doesn't have any trouble with her breathing while asleep.  Her husband never mentioned that she stops breathing at night.  She goes to bed between 9 and 11 pm.  She falls asleep quickly.  She wakes up to use the once to use the bathroom.  She was up early first to get her husband ready for work and then her kids for school.  She starts her day around 6:30 am.  She feels okay in the morning.  Not using anything to help sleep or stay awake.  Doesn't have trouble with sleepy during the day.  She denies sleep walking, sleep talking, bruxism, or nightmares.  There is no history of restless legs.  She denies sleep hallucinations, sleep paralysis, or cataplexy.  The Epworth score is 3 out of 24.   Physical Exam:   Appearance - well kempt   ENMT  - no sinus tenderness, no oral exudate, no LAN, Mallampati 2 airway, no stridor  Respiratory - equal breath sounds bilaterally, no wheezing or rales  CV - s1s2 regular rate and rhythm, no murmurs  Ext - no clubbing, no edema  Skin - no rashes  Psych - normal mood and affect   Social History:  She  reports that she has never smoked. She has never used smokeless tobacco. She reports that she does not drink alcohol and does not use drugs.  Family History:  Her family history includes Hypertension in her father and mother.    Discussion:  She has been scheduled for bariatric surgery in [place] and this will be done in June 2024.  She has occasional snoring.  She does not have any other significant symptoms that would raise concern for sleep disordered breathing, and does not have any other significant medical conditions that would be associated with sleep disordered breathing.  She does not have any other respiratory symptoms.  I don't think she needs any additional pulmonary or sleep testing at this time and can proceed with bariatric surgery.  She can call to schedule a follow up if needed after surgery.  Time Spent Involved in Patient Care on Day of Examination:  36 minutes  Follow up:   Patient Instructions  Follow up as needed  Medication List:   Allergies as of 10/16/2022   No Known Allergies      Medication List        Accurate as of Oct 16, 2022  2:49 PM. If you have any questions, ask your nurse or doctor.          Insulin Pen Needle 32G X 4 MM Misc Use daily with saxenda   metFORMIN 500 MG 24 hr tablet Commonly known as: GLUCOPHAGE-XR Take 500 mg by mouth daily before breakfast.   Synthroid 175 MCG tablet Generic drug: levothyroxine Take 1 tablet (175 mcg total) by mouth daily before breakfast.        Signature:  Coralyn Helling, MD [facility] Pulmonary/Critical Care Pager - [phone] 10/16/2022, 2:49 PM

## 2022-10-16 NOTE — Patient Instructions (Signed)
Follow up as needed

## 2022-12-10 DIAGNOSIS — E119 Type 2 diabetes mellitus without complications: Secondary | ICD-10-CM | POA: Diagnosis not present

## 2022-12-10 DIAGNOSIS — K762 Central hemorrhagic necrosis of liver: Secondary | ICD-10-CM | POA: Diagnosis not present

## 2022-12-10 DIAGNOSIS — E7800 Pure hypercholesterolemia, unspecified: Secondary | ICD-10-CM | POA: Diagnosis not present

## 2022-12-10 DIAGNOSIS — D509 Iron deficiency anemia, unspecified: Secondary | ICD-10-CM | POA: Diagnosis not present

## 2022-12-10 DIAGNOSIS — E559 Vitamin D deficiency, unspecified: Secondary | ICD-10-CM | POA: Diagnosis not present

## 2022-12-10 DIAGNOSIS — D649 Anemia, unspecified: Secondary | ICD-10-CM | POA: Diagnosis not present

## 2022-12-10 DIAGNOSIS — R790 Abnormal level of blood mineral: Secondary | ICD-10-CM | POA: Diagnosis not present

## 2022-12-12 ENCOUNTER — Encounter: Payer: Self-pay | Admitting: Internal Medicine

## 2023-03-06 ENCOUNTER — Encounter: Payer: Self-pay | Admitting: Internal Medicine

## 2023-03-06 ENCOUNTER — Ambulatory Visit: Payer: BC Managed Care – PPO | Admitting: Internal Medicine

## 2023-03-06 VITALS — BP 122/80 | HR 71 | Ht 63.0 in | Wt 222.0 lb

## 2023-03-06 DIAGNOSIS — E039 Hypothyroidism, unspecified: Secondary | ICD-10-CM | POA: Diagnosis not present

## 2023-03-06 NOTE — Progress Notes (Unsigned)
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 She has followed with [facility] since 12/15/2020 for consultative assistance with management of her Hypothyroidism.   HISTORICAL SUMMARY:   During evaluation for obesity through the [facility] healthy weight and wellness she was noted with a TSH of 226 uIU/mL     She was diagnosed with hypothyroidism since the age 24. Has been on LT-4 replacement  Has 2 kids 65 and 7  Mother with hx of thyroid disease  SUBJECTIVE:     Today (03/06/2023):  Kristi Curtis is here for a follow up on hypothyroidism.   Pt has been noted with weight loss after gastric sleeve sx 10/2022 She is recovering well , had vomiting once since sx.  She feels energetic  She has been exercising  Denies local neck swelling   Denies constipation or diarrhea  Denies palpitations  Denies hand tremors   She is on Biotin since the past month  She is Bariatric vitamins      Synthroid 175 mcg  Monday through Saturday     HISTORY:  Past Medical History:  Past Medical History:  Diagnosis Date   Body aches    Fatigue    Hypertension    Hypothyroidism    Lower extremity edema    SOB (shortness of breath)    Thyroid disease    Past Surgical History:  Past Surgical History:  Procedure Laterality Date   BREAST BIOPSY Right 09/14/2022   MM RT BREAST BX W LOC DEV 1ST LESION IMAGE BX SPEC STEREO GUIDE 09/14/2022 [facility]   BREAST BIOPSY Right 09/14/2022   MM RT BREAST BX W LOC DEV EA AD LESION IMG BX SPEC STEREO GUIDE 09/14/2022 [facility]   CESAREAN SECTION     CHOLECYSTECTOMY     LIPOSUCTION     LIPOSUCTION     Social History:  reports that  she has never smoked. She has never used smokeless tobacco. She reports that she does not drink alcohol and does not use drugs. Family History:  Family History  Problem Relation Age of Onset   Hypertension Mother    Hypertension Father      HOME MEDICATIONS: Allergies as of 03/06/2023   No Known Allergies      Medication List        Accurate as of March 06, 2023  1:46 PM. If you have any questions, ask your nurse or doctor.          cyclobenzaprine 5 MG tablet Commonly known as: FLEXERIL Take 5 mg by mouth at bedtime.   diclofenac 75 MG EC tablet Commonly known as: VOLTAREN PLEASE SEE ATTACHED FOR DETAILED DIRECTIONS   Insulin Pen Needle 32G X 4 MM Misc Use daily with saxenda   metFORMIN 500 MG 24 hr tablet Commonly known as: GLUCOPHAGE-XR Take 500 mg by mouth daily before breakfast.   ondansetron 4 MG disintegrating tablet Commonly known as: ZOFRAN-ODT Take 4-8 mg by mouth every 6 (six) hours as needed.   pantoprazole 40 MG tablet Commonly known as: PROTONIX Take 40 mg by mouth daily.   Synthroid 175 MCG tablet Generic drug: levothyroxine Take 1 tablet (175 mcg  total) by mouth daily before breakfast. What changed: additional instructions   Vitamin D (Ergocalciferol) 1.25 MG (50000 UNIT) Caps capsule Commonly known as: DRISDOL Take 50,000 Units by mouth once a week.   Xarelto 10 MG Tabs tablet Generic drug: rivaroxaban Take 10 mg by mouth daily.          OBJECTIVE:   PHYSICAL EXAM: VS: BP 122/80 (BP Location: Left Arm, Patient Position: Sitting, Cuff Size: Large)   Pulse 71   Ht 5\' 3"  (1.6 m)   Wt 222 lb (100.7 kg)   SpO2 99%   BMI 39.33 kg/m    EXAM: General: Pt appears well and is in NAD  Neck: General: Supple without adenopathy. Thyroid: Thyroid size normal.  No goiter or nodules appreciated.   Lungs: Clear with good BS bilat   Heart: Auscultation: RRR.  Extremities:  BL LE: No pretibial edema normal ROM and strength.  Mental  Status: Judgment, insight: Intact Mood and affect: No depression, anxiety, or agitation     DATA REVIEWED:  Latest Reference Range & Units 02/16/21 08:10 06/25/22 11:14  TSH 0.35 - 5.50 uIU/mL 0.18 (L) 0.07 (L)  (L): Data is abnormally low   ASSESSMENT / PLAN / RECOMMENDATIONS:   Hypothyroidism :  -Patient is clinically euthyroid -No local neck symptoms -She is taking Synthroid appropriately -Unable to check TFTs today as she is on biotin, patient was advised to hold biotin 2-3 days prior to any future thyroid blood work -She will return for repeat labs    Medications   Continue Synthroid 175 mcg , 1 tablet Monday through Saturday and none on Sundays   Signed electronically by: Lyndle Herrlich, MD  [facility]  [facility] [address] [place] [place], [place] [address] Phone: [phone] FAX: [phone]      CC: Shireen Quan, DO [address] [place] [address] Phone: [phone]  Fax: [phone]   Return to Endocrinology clinic as below: Future Appointments  Date Time Provider Department Center  06/26/2023 11:10 AM Adonay Scheier, Konrad Dolores, MD [facility] None

## 2023-03-06 NOTE — Patient Instructions (Signed)

## 2023-03-11 ENCOUNTER — Other Ambulatory Visit (INDEPENDENT_AMBULATORY_CARE_PROVIDER_SITE_OTHER): Payer: BC Managed Care – PPO

## 2023-03-11 DIAGNOSIS — E039 Hypothyroidism, unspecified: Secondary | ICD-10-CM | POA: Diagnosis not present

## 2023-03-11 LAB — TSH: TSH: 0.07 u[IU]/mL — ABNORMAL LOW (ref 0.35–5.50)

## 2023-03-11 LAB — T4, FREE: Free T4: 1.48 ng/dL (ref 0.60–1.60)

## 2023-03-12 ENCOUNTER — Other Ambulatory Visit: Payer: Self-pay | Admitting: Internal Medicine

## 2023-03-12 MED ORDER — SYNTHROID 175 MCG PO TABS: 175.0000 ug | ORAL_TABLET | ORAL | 2 refills | Status: DC

## 2023-04-15 DIAGNOSIS — R790 Abnormal level of blood mineral: Secondary | ICD-10-CM | POA: Diagnosis not present

## 2023-04-15 DIAGNOSIS — D649 Anemia, unspecified: Secondary | ICD-10-CM | POA: Diagnosis not present

## 2023-04-15 DIAGNOSIS — E0590 Thyrotoxicosis, unspecified without thyrotoxic crisis or storm: Secondary | ICD-10-CM | POA: Diagnosis not present

## 2023-04-15 DIAGNOSIS — E7800 Pure hypercholesterolemia, unspecified: Secondary | ICD-10-CM | POA: Diagnosis not present

## 2023-04-15 DIAGNOSIS — K762 Central hemorrhagic necrosis of liver: Secondary | ICD-10-CM | POA: Diagnosis not present

## 2023-04-15 DIAGNOSIS — E119 Type 2 diabetes mellitus without complications: Secondary | ICD-10-CM | POA: Diagnosis not present

## 2023-06-26 ENCOUNTER — Ambulatory Visit: Payer: BC Managed Care – PPO | Admitting: Internal Medicine

## 2023-06-26 NOTE — Progress Notes (Deleted)
 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 She has followed with [facility] since 12/15/2020 for consultative assistance with management of her Hypothyroidism.   HISTORICAL SUMMARY:   During evaluation for obesity through the [facility] healthy weight and wellness she was noted with a TSH of 226 uIU/mL     She was diagnosed with hypothyroidism since the age 66. Has been on LT-4 replacement  Has 2 kids 62 and 7  Mother with hx of thyroid  disease  SUBJECTIVE:     Today (06/26/2023):  Kristi Curtis is here for a follow up on hypothyroidism.   Kristi Curtis has been noted with weight loss after gastric sleeve sx 10/2022 She is recovering well , had vomiting once since sx.  She feels energetic  She has been exercising  Denies local neck swelling   Denies constipation or diarrhea  Denies palpitations  Denies hand tremors   She is on Biotin since the past month  She is Bariatric vitamins      Synthroid  175 mcg  Monday through Friday , none on Saturdays or Sundays    HISTORY:  Past Medical History:  Past Medical History:  Diagnosis Date   Body aches    Fatigue    Hypertension    Hypothyroidism    Lower extremity edema    SOB (shortness of breath)    Thyroid  disease    Past Surgical History:  Past Surgical History:  Procedure Laterality Date   BREAST BIOPSY Right 09/14/2022   MM RT BREAST BX W LOC DEV 1ST LESION IMAGE BX SPEC STEREO GUIDE 09/14/2022 [facility]   BREAST BIOPSY Right 09/14/2022   MM RT BREAST BX W LOC DEV EA AD LESION IMG BX SPEC STEREO GUIDE 09/14/2022 [facility]   CESAREAN SECTION     CHOLECYSTECTOMY     LIPOSUCTION      LIPOSUCTION     Social History:  reports that she has never smoked. She has never used smokeless tobacco. She reports that she does not drink alcohol and does not use drugs. Family History:  Family History  Problem Relation Age of Onset   Hypertension Mother    Hypertension Father      HOME MEDICATIONS: Allergies as of 06/26/2023   No Known Allergies      Medication List        Accurate as of June 26, 2023  7:04 AM. If you have any questions, ask your nurse or doctor.          cyclobenzaprine 5 MG tablet Commonly known as: FLEXERIL Take 5 mg by mouth at bedtime.   diclofenac 75 MG EC tablet Commonly known as: VOLTAREN PLEASE SEE ATTACHED FOR DETAILED DIRECTIONS   Insulin  Pen Needle 32G X 4 MM Misc Use daily with saxenda    metFORMIN 500 MG 24 hr tablet Commonly known as: GLUCOPHAGE-XR Take 500 mg by mouth daily before breakfast.   ondansetron  4 MG disintegrating tablet Commonly known as: ZOFRAN -ODT Take 4-8 mg by mouth every 6 (six) hours as needed.   pantoprazole 40 MG tablet Commonly known as: PROTONIX Take 40 mg by mouth daily.   Synthroid  175 MCG tablet Generic drug:  levothyroxine Take 1 tablet (175 mcg total) by mouth as directed. 1 tablet Monday through Friday and none on Saturday or Sunday   Vitamin D (Ergocalciferol) 1.25 MG (50000 UNIT) Caps capsule Commonly known as: DRISDOL Take 50,000 Units by mouth once a week.   Xarelto 10 MG Tabs tablet Generic drug: rivaroxaban Take 10 mg by mouth daily.          OBJECTIVE:   PHYSICAL EXAM: VS: There were no vitals taken for this visit.   EXAM: General: Kristi Curtis appears well and is in NAD  Neck: General: Supple without adenopathy. Thyroid : Thyroid  size normal.  No goiter or nodules appreciated.   Lungs: Clear with good BS bilat   Heart: Auscultation: RRR.  Extremities:  BL LE: No pretibial edema normal ROM and strength.  Mental Status: Judgment, insight: Intact Mood and affect: No depression,  anxiety, or agitation     DATA REVIEWED:  Latest Reference Range & Units 02/16/21 08:10 06/25/22 11:14  TSH 0.35 - 5.50 uIU/mL 0.18 (L) 0.07 (L)  (L): Data is abnormally low   ASSESSMENT / PLAN / RECOMMENDATIONS:   Hypothyroidism :  -Patient is clinically euthyroid -No local neck symptoms -She is taking Synthroid  appropriately -Unable to check TFTs today as she is on biotin, patient was advised to hold biotin 2-3 days prior to any future thyroid  blood work -She will return for repeat labs    Medications   Continue Synthroid  175 mcg , 1 tablet Monday through Saturday and none on Sundays   Signed electronically by: Stefano Redgie Butts, MD  [facility]inology  [facility] [address] [place] [place], [place] [address] Phone: [phone] FAX: [phone]      CC: Anastasio Duncans, DO (Inactive) [address] [place] [address] Phone: [phone]  Fax: [phone]   Return to Endocrinology clinic as below: Future Appointments  Date Time Provider Department Center  06/26/2023 11:10 AM Ruslan Mccabe, Donell Redgie, MD [facility] None  07/08/2023 11:30 AM Janiel Crisostomo, Donell Redgie, MD [facility] None

## 2023-07-08 ENCOUNTER — Ambulatory Visit: Payer: BC Managed Care – PPO | Admitting: Internal Medicine

## 2023-07-08 ENCOUNTER — Encounter: Payer: Self-pay | Admitting: Internal Medicine

## 2023-07-08 VITALS — BP 120/80 | HR 61 | Ht 63.0 in | Wt 204.0 lb

## 2023-07-08 DIAGNOSIS — E039 Hypothyroidism, unspecified: Secondary | ICD-10-CM | POA: Diagnosis not present

## 2023-07-08 NOTE — Progress Notes (Unsigned)
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 She has followed with [facility] since 12/15/2020 for consultative assistance with management of her Hypothyroidism.   HISTORICAL SUMMARY:   During evaluation for obesity through the [facility] healthy weight and wellness she was noted with a TSH of 226 uIU/mL     She was diagnosed with hypothyroidism since the age 24. Has been on LT-4 replacement  Has 2 kids 44 and 7  Mother with hx of thyroid disease  SUBJECTIVE:     Today (07/08/2023):  Kristi Curtis is here for a follow up on hypothyroidism.   Pt continues with weight loss after gastric sleeve sx 10/2022 Denies local neck swelling   Denies constipation or diarrhea  Denies palpitations  Denies hand tremors   She is on Biotin since the past month  She is Bariatric vitamins   She is on Collagen   Scheduled for breast and thigh sx  in 10/2023  Synthroid 175 mcg  Monday through Friday , none on Saturdays or Sundays    HISTORY:  Past Medical History:  Past Medical History:  Diagnosis Date   Body aches    Fatigue    Hypertension    Hypothyroidism    Lower extremity edema    SOB (shortness of breath)    Thyroid disease    Past Surgical History:  Past Surgical History:  Procedure Laterality Date   BREAST BIOPSY Right 09/14/2022   MM RT BREAST BX W LOC DEV 1ST LESION IMAGE BX SPEC STEREO GUIDE 09/14/2022 [facility]   BREAST BIOPSY Right 09/14/2022   MM RT BREAST BX W LOC DEV EA AD LESION IMG BX SPEC STEREO GUIDE 09/14/2022 [facility]   CESAREAN SECTION     CHOLECYSTECTOMY     LIPOSUCTION     LIPOSUCTION     Social History:  reports that she  has never smoked. She has never used smokeless tobacco. She reports that she does not drink alcohol and does not use drugs. Family History:  Family History  Problem Relation Age of Onset   Hypertension Mother    Hypertension Father      HOME MEDICATIONS: Allergies as of 07/08/2023   No Known Allergies      Medication List        Accurate as of July 08, 2023 11:23 AM. If you have any questions, ask your nurse or doctor.          STOP taking these medications    cyclobenzaprine 5 MG tablet Commonly known as: FLEXERIL Stopped by: Johnney Ou Jameir Ake   diclofenac 75 MG EC tablet Commonly known as: VOLTAREN Stopped by: Johnney Ou [place]   ondansetron 4 MG disintegrating tablet Commonly known as: ZOFRAN-ODT Stopped by: Johnney Ou Sanders Manninen       TAKE these medications    Insulin Pen Needle 32G X 4 MM Misc Use daily with saxenda   metFORMIN 500 MG 24 hr tablet Commonly known as: GLUCOPHAGE-XR Take 500 mg by mouth daily before breakfast.   pantoprazole 40 MG tablet Commonly known as: PROTONIX Take 40 mg by mouth daily.   Synthroid 175 MCG tablet Generic drug: levothyroxine Take  1 tablet (175 mcg total) by mouth as directed. 1 tablet Monday through Friday and none on Saturday or Sunday   Vitamin D (Ergocalciferol) 1.25 MG (50000 UNIT) Caps capsule Commonly known as: DRISDOL Take 50,000 Units by mouth once a week.   Xarelto 10 MG Tabs tablet Generic drug: rivaroxaban Take 10 mg by mouth daily.          OBJECTIVE:   PHYSICAL EXAM: VS: BP 120/80 (BP Location: Left Arm, Patient Position: Sitting, Cuff Size: Normal)   Pulse 61   Ht 5\' 3"  (1.6 m)   Wt 204 lb (92.5 kg)   SpO2 99%   BMI 36.14 kg/m    EXAM: General: Pt appears well and is in NAD  Neck: General: Supple without adenopathy. Thyroid: Thyroid size normal.  No goiter or nodules appreciated.   Lungs: Clear with good BS bilat   Heart: Auscultation: RRR.  Extremities:  BL LE: No  pretibial edema   Mental Status: Judgment, insight: Intact Mood and affect: No depression, anxiety, or agitation     DATA REVIEWED:   Latest Reference Range & Units 07/08/23 11:45  TSH mIU/L 0.13 (L)  T4,Free(Direct) 0.8 - 1.8 ng/dL 1.7  (L): Data is abnormally low   04/2023  H/H 14.8/46.9 Gluc 88 BUN 9 Cr.0.7 GFR 107 Na 141 K 4.2 TSH 0.818 FT4 1.66   ASSESSMENT / PLAN / RECOMMENDATIONS:   Hypothyroidism :  -Patient is clinically euthyroid -No local neck symptoms -She is taking Synthroid appropriately -TSH continues to be low, we discussed the risk of cardiac arrhythmia in the setting of iatrogenic hyperthyroid and surgery -Will decrease Synthroid as below    Medications   Stop Synthroid 175 mcg , 1 tablet Monday through Friday,none on Friday, Saturday, Sundays START Synthroid 100 mcg daily  Signed electronically by: Lyndle Herrlich, MD  [facility] Endocrinology  [facility] [address] [place] [place], [place] [address] Phone: [phone] FAX: [phone]      CC: Shireen Quan, DO (Inactive) [address] [place] [address] Phone: [phone]  Fax: [phone]   Return to Endocrinology clinic as below: Future Appointments  Date Time Provider Department Center  07/08/2023 11:30 AM Kirtis Challis, Konrad Dolores, MD [facility] None

## 2023-07-09 ENCOUNTER — Encounter: Payer: Self-pay | Admitting: Internal Medicine

## 2023-07-09 LAB — T4, FREE: Free T4: 1.7 ng/dL (ref 0.8–1.8)

## 2023-07-09 LAB — TSH: TSH: 0.13 m[IU]/L — ABNORMAL LOW

## 2023-07-09 MED ORDER — SYNTHROID 100 MCG PO TABS: 100.0000 ug | ORAL_TABLET | Freq: Every day | ORAL | 3 refills | Status: DC

## 2023-07-11 ENCOUNTER — Other Ambulatory Visit: Payer: Self-pay

## 2023-07-11 MED ORDER — SYNTHROID 100 MCG PO TABS: 100.0000 ug | ORAL_TABLET | Freq: Every day | ORAL | 3 refills | Status: DC

## 2023-08-27 DIAGNOSIS — Z01419 Encounter for gynecological examination (general) (routine) without abnormal findings: Secondary | ICD-10-CM | POA: Diagnosis not present

## 2023-08-27 DIAGNOSIS — Z1231 Encounter for screening mammogram for malignant neoplasm of breast: Secondary | ICD-10-CM | POA: Diagnosis not present

## 2023-08-27 DIAGNOSIS — Z6837 Body mass index (BMI) 37.0-37.9, adult: Secondary | ICD-10-CM | POA: Diagnosis not present

## 2023-10-09 DIAGNOSIS — Z6838 Body mass index (BMI) 38.0-38.9, adult: Secondary | ICD-10-CM | POA: Diagnosis not present

## 2023-10-09 DIAGNOSIS — Z01818 Encounter for other preprocedural examination: Secondary | ICD-10-CM | POA: Diagnosis not present

## 2023-10-09 DIAGNOSIS — E66812 Obesity, class 2: Secondary | ICD-10-CM | POA: Diagnosis not present

## 2023-10-09 DIAGNOSIS — R634 Abnormal weight loss: Secondary | ICD-10-CM | POA: Diagnosis not present

## 2023-10-28 NOTE — Procedures (Signed)
 Physician's Post Procedure Note  Kristi Curtis        MRN: [id number]       DOB: [birth year]-48 y.o. female  Procedure(s) completed in this case Procedure(s): BILATERAL BREAST REDUCTION LIPOSUCTION BILATERAL CHEST BILATERAL BRACHIOPLASTY  Procedure Date: 10/28/2023     Pre-Procedure Diagnosis: Macromastia, upper arm laxity, fat deposit of the lateral chest  Post-Procedure Diagnosis: Macromastia, upper arm laxity, fat deposit of the lateral chest  Findings: Liposuction and fusion 1000, total aspirate 700, fat aspirate 400.    []   This is a possible planned or staged procedure which may require a return to             surgery.      Procedure/Description: BILATERAL BREAST REDUCTION, LIPOSUCTION BILATERAL CHEST, BILATERAL BRACHIOPLASTY     Anesthesia: General   Physician:  Surgeons and Role:    * Aida Dale Schillings, MD - Primary   Surgical Staff:  Circulator: Stefano JONELLE Persons, RN; Vicenta Bottoms, RN Scrub Person: Verla Patch; Vernona Myron Surgical Assistant: Evelin Maria Moreno Barrera, APRN     Anesthesia Staff: Anesthesiologist: Laline L Rivero, MD; Vinoop Viswanathan, MD Anesthesiologist Assistant: Blane Hadassah Eastern, CAA; Nat LITTIE Molt   INDICATIONS FOR THE PROCEDURE:  [name] is a very nice 48 y.o. -year-old woman who presented with complaints focusing on her breasts, upper arm laxity, and fat deposits of her lateral chest. She had significant macromastia and ptosis. After a thorough discussion of the risks, benefits, alternatives, limitations, expected results, recovery and scars, it was elected to proceed.    PROCEDURE IN DETAIL:  Kristi Curtis was seen in the preoperative holding area where she was marked in the upright position for breast landmarks and for Wise pattern skin excisions and for elliptical excisions along the medial aspects of her upper arms as well as a topographical outline of the fat deposits of her lateral chest. She  was then brought to the operating room and placed in the supine position. Following administration of anesthesia, her anterior torso and arms were prepped and draped in the usual sterile fashion. She was brought into the upright position where the markings were adjusted and confirmed using a tailor-tacking method and a 4 cm circular template. She was returned to the supine position.   The lateral chest was infused with a tumescent solution and then suctioned until a significantly improved contour and no further fat could be aspirated.   The right breast was approached first. The skin markings were incised. The inferior pedicle was deepithelialized. Flaps were mobilized medially, laterally and cephalad. Tissue was then resected from the lateral, superior and medial portions of the breasts, leaving a 7-8 cm well perfused pedicle. The flaps were approximated without any undue tension. An identical procedure was performed on the contralateral side for symmetry.  All incisions were then closed in layered fashion with 2-0 PDS for the key point approximations, 3-0 PDS and 3-0 Monocryl for the vertical and inframammary incision and 3-0 Monocryl and 4-0 Monocryl for the periareolar closure.   The upper arm markings were adjusted and confirmed using a tailor-tacking method. The left arm was approached first. The skin markings were incised. Intervening soft tissue panel was resected at the level of the superficial fascia. The flaps were approximated with an appropriate amount of tension and the superficial fascial system was approximated with 2-0 PDS for the fascial closure followed by 3-0 PDS for the deep dermis and 3-0 Monocryl as a continuous intracuticular suture. A  similar procedure was performed on the right side.   The needle, instrument and sponge counts were reported as being correct. No intraoperative complications were noted.    ESTIMATED BLOOD LOSS: 60 cc.   SPECIMENS REMOVED:  Breast.   SPECIMEN DISPOSITION: Pathology   Complications or Unexpected Outcomes: None  Dictation #:  Electronically signed by Aida Dale Schillings, MD

## 2024-01-13 ENCOUNTER — Ambulatory Visit (INDEPENDENT_AMBULATORY_CARE_PROVIDER_SITE_OTHER): Payer: BC Managed Care – PPO | Admitting: Internal Medicine

## 2024-01-13 ENCOUNTER — Encounter: Payer: Self-pay | Admitting: Internal Medicine

## 2024-01-13 VITALS — Ht 63.0 in | Wt 212.0 lb

## 2024-01-13 DIAGNOSIS — E039 Hypothyroidism, unspecified: Secondary | ICD-10-CM

## 2024-01-13 LAB — T4, FREE: Free T4: 1.2 ng/dL (ref 0.8–1.8)

## 2024-01-13 LAB — TSH: TSH: 4.73 m[IU]/L — ABNORMAL HIGH

## 2024-01-13 NOTE — Progress Notes (Unsigned)
 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 She has followed with [facility] since 12/15/2020 for consultative assistance with management of her Hypothyroidism.   HISTORICAL SUMMARY:   During evaluation for obesity through the [facility] healthy weight and wellness she was noted with a TSH of 226 uIU/mL     She was diagnosed with hypothyroidism since the age 64. Has been on LT-4 replacement  Has 2 kids 20 and 7  Mother with hx of thyroid  disease  SUBJECTIVE:     Today (01/13/2024):  Kristi Curtis is here for a follow up on hypothyroidism.   She is s/p gastric sleeve sx 10/2022 No local neck swelling  No palpitations  Denies constipation or diarrhea  Sleeps well at  night Energy level is good    Has not been on Biotin for a few weeks  She is Bariatric vitamins    Synthroid  100 mcg  daily    HISTORY:  Past Medical History:  Past Medical History:  Diagnosis Date   Body aches    Fatigue    Hypertension    Hypothyroidism    Lower extremity edema    SOB (shortness of breath)    Thyroid  disease    Past Surgical History:  Past Surgical History:  Procedure Laterality Date   BREAST BIOPSY Right 09/14/2022   MM RT BREAST BX W LOC DEV 1ST LESION IMAGE BX SPEC STEREO GUIDE 09/14/2022 [facility]   BREAST BIOPSY Right 09/14/2022   MM RT BREAST BX W LOC DEV EA AD LESION IMG BX SPEC STEREO GUIDE 09/14/2022 [facility]MO[facility]   CESAREAN SECTION     CHOLECYSTECTOMY     LIPOSUCTION     LIPOSUCTION     Social History:  reports that she has never smoked. She has never used smokeless tobacco. She reports that she does not drink alcohol and does not use  drugs. Family History:  Family History  Problem Relation Age of Onset   Hypertension Mother    Hypertension Father      HOME MEDICATIONS: Allergies as of 01/13/2024   No Known Allergies      Medication List        Accurate as of January 13, 2024 10:59 AM. If you have any questions, ask your nurse or doctor.          Insulin  Pen Needle 32G X 4 MM Misc Use daily with saxenda    metFORMIN 500 MG 24 hr tablet Commonly known as: GLUCOPHAGE-XR Take 500 mg by mouth daily before breakfast.   pantoprazole 40 MG tablet Commonly known as: PROTONIX Take 40 mg by mouth daily.   Synthroid  100 MCG tablet Generic drug: levothyroxine Take 1 tablet (100 mcg total) by mouth daily before breakfast. To replace the 175 mcg dose   Vitamin D (Ergocalciferol) 1.25 MG (50000 UNIT) Caps capsule Commonly known as: DRISDOL Take 50,000 Units by mouth once a week.   Xarelto 10 MG Tabs tablet Generic drug: rivaroxaban Take 10 mg by mouth daily.          OBJECTIVE:   PHYSICAL EXAM: VS: Ht 5' 3 (1.6 m)   Wt 212 lb (  96.2 kg)   BMI 37.55 kg/m    EXAM: General: Pt appears well and is in NAD  Neck: General: Supple without adenopathy. Thyroid : Thyroid  size normal.  No goiter or nodules appreciated.   Lungs: Clear with good BS bilat   Heart: Auscultation: RRR.  Extremities:  BL LE: No pretibial edema   Mental Status: Judgment, insight: Intact Mood and affect: No depression, anxiety, or agitation     DATA REVIEWED:   Latest Reference Range & Units 01/13/24 11:21  TSH mIU/L 4.73 (H)  T4,Free(Direct) 0.8 - 1.8 ng/dL 1.2     87/7975  H/H 85.1/53.0 Gluc 88 BUN 9 Cr.0.7 GFR 107 Na 141 K 4.2 TSH 0.818 FT4 1.66   ASSESSMENT / PLAN / RECOMMENDATIONS:   Hypothyroidism :  -Patient is clinically euthyroid -No local neck symptoms -She is taking Synthroid  appropriately - TSH is elevated, I will increase Synthroid  as below - Will recheck in 2 months  Medications    Increase Synthroid  100 mcg, 1.5 tabs on Sundays and 1 tablet rest of the week  Signed electronically by: Stefano Redgie Butts, MD  [facility] Endocrinology  [facility] [address] [place] [place], [place] [address] Phone: [phone] FAX: [phone]      CC: Anastasio Duncans, DO (Inactive) [address] [place] [address] Phone: [phone]  Fax: [phone]   Return to Endocrinology clinic as below: Future Appointments  Date Time Provider Department Center  01/13/2024 11:10 AM Mahir Prabhakar, Donell Redgie, MD [facility] None

## 2024-01-14 ENCOUNTER — Ambulatory Visit: Payer: Self-pay | Admitting: Internal Medicine

## 2024-01-14 MED ORDER — SYNTHROID 100 MCG PO TABS: 100.0000 ug | ORAL_TABLET | ORAL | 3 refills | Status: DC

## 2024-03-18 ENCOUNTER — Other Ambulatory Visit

## 2024-03-18 DIAGNOSIS — E039 Hypothyroidism, unspecified: Secondary | ICD-10-CM | POA: Diagnosis not present

## 2024-03-19 LAB — TSH: TSH: 5.81 m[IU]/L — ABNORMAL HIGH

## 2024-03-19 MED ORDER — SYNTHROID 112 MCG PO TABS: 112.0000 ug | ORAL_TABLET | Freq: Every day | ORAL | 3 refills | Status: DC

## 2024-03-19 MED ORDER — SYNTHROID 112 MCG PO TABS: 112.0000 ug | ORAL_TABLET | Freq: Every day | ORAL | 3 refills | Status: AC

## 2024-03-19 NOTE — Addendum Note (Signed)
 Addended by: ARLOA JEOFFREY SAILOR on: 03/19/2024 04:18 PM   Modules accepted: Orders

## 2024-03-19 NOTE — Addendum Note (Signed)
 Addended by: SAM DONELL PARAS on: 03/19/2024 12:50 PM   Modules accepted: Orders

## 2024-07-20 ENCOUNTER — Ambulatory Visit: Admitting: Internal Medicine
